# Patient Record
Sex: Male | Born: 1945 | Race: White | Hispanic: No | Marital: Married | Smoking: Never smoker
Health system: Southern US, Community
[De-identification: ages and names within clinical notes are randomized; demographics above are authoritative.]

## PROBLEM LIST (undated history)

## (undated) DIAGNOSIS — M1990 Unspecified osteoarthritis, unspecified site: Secondary | ICD-10-CM

## (undated) DIAGNOSIS — I10 Essential (primary) hypertension: Secondary | ICD-10-CM

## (undated) DIAGNOSIS — C801 Malignant (primary) neoplasm, unspecified: Secondary | ICD-10-CM

## (undated) DIAGNOSIS — Z9289 Personal history of other medical treatment: Secondary | ICD-10-CM

## (undated) DIAGNOSIS — R7303 Prediabetes: Secondary | ICD-10-CM

## (undated) DIAGNOSIS — K760 Fatty (change of) liver, not elsewhere classified: Secondary | ICD-10-CM

## (undated) DIAGNOSIS — Z87442 Personal history of urinary calculi: Secondary | ICD-10-CM

## (undated) DIAGNOSIS — E119 Type 2 diabetes mellitus without complications: Secondary | ICD-10-CM

## (undated) DIAGNOSIS — G4730 Sleep apnea, unspecified: Secondary | ICD-10-CM

## (undated) HISTORY — PX: CARPAL TUNNEL RELEASE: SHX101

## (undated) HISTORY — PX: LITHOTRIPSY: SUR834

## (undated) HISTORY — PX: JOINT REPLACEMENT: SHX530

## (undated) HISTORY — PX: COLONOSCOPY: SHX174

## (undated) HISTORY — PX: CYSTOSCOPY/RETROGRADE/URETEROSCOPY/STONE EXTRACTION WITH BASKET: SHX5317

---
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
## 1982-03-06 HISTORY — PX: KIDNEY STONE SURGERY: SHX686

## 2008-05-14 ENCOUNTER — Encounter: Admission: RE | Admit: 2008-05-14 | Discharge: 2008-05-14 | Payer: Self-pay | Admitting: Neurosurgery

## 2008-06-17 ENCOUNTER — Encounter: Admission: RE | Admit: 2008-06-17 | Discharge: 2008-06-17 | Payer: Self-pay | Admitting: Neurosurgery

## 2008-12-18 ENCOUNTER — Encounter: Admission: RE | Admit: 2008-12-18 | Discharge: 2008-12-18 | Payer: Self-pay | Admitting: Neurosurgery

## 2010-03-06 HISTORY — PX: TOTAL HIP ARTHROPLASTY: SHX124

## 2010-03-27 ENCOUNTER — Encounter: Payer: Self-pay | Admitting: Neurosurgery

## 2010-12-21 ENCOUNTER — Other Ambulatory Visit (HOSPITAL_COMMUNITY): Payer: Self-pay | Admitting: Orthopedic Surgery

## 2010-12-21 ENCOUNTER — Other Ambulatory Visit: Payer: Self-pay | Admitting: Orthopedic Surgery

## 2010-12-21 ENCOUNTER — Ambulatory Visit (HOSPITAL_COMMUNITY)
Admission: RE | Admit: 2010-12-21 | Discharge: 2010-12-21 | Disposition: A | Discharge status: Home / Self Care | Payer: Medicare Other | Source: Ambulatory Visit | Attending: Orthopedic Surgery | Admitting: Orthopedic Surgery

## 2010-12-21 ENCOUNTER — Encounter (HOSPITAL_COMMUNITY): Payer: Medicare Other

## 2010-12-21 DIAGNOSIS — Z01818 Encounter for other preprocedural examination: Secondary | ICD-10-CM | POA: Insufficient documentation

## 2010-12-21 DIAGNOSIS — M47814 Spondylosis without myelopathy or radiculopathy, thoracic region: Secondary | ICD-10-CM | POA: Insufficient documentation

## 2010-12-21 DIAGNOSIS — Z01811 Encounter for preprocedural respiratory examination: Secondary | ICD-10-CM

## 2010-12-21 DIAGNOSIS — M169 Osteoarthritis of hip, unspecified: Secondary | ICD-10-CM | POA: Insufficient documentation

## 2010-12-21 DIAGNOSIS — Z01810 Encounter for preprocedural cardiovascular examination: Secondary | ICD-10-CM | POA: Insufficient documentation

## 2010-12-21 DIAGNOSIS — M1610 Unilateral primary osteoarthritis, unspecified hip: Secondary | ICD-10-CM | POA: Insufficient documentation

## 2010-12-21 DIAGNOSIS — Z01812 Encounter for preprocedural laboratory examination: Secondary | ICD-10-CM | POA: Insufficient documentation

## 2010-12-21 LAB — PROTIME-INR
INR: 1.03 (ref 0.00–1.49)
Prothrombin Time: 13.7 seconds (ref 11.6–15.2)

## 2010-12-21 LAB — COMPREHENSIVE METABOLIC PANEL
Albumin: 4 g/dL (ref 3.5–5.2)
Alkaline Phosphatase: 76 U/L (ref 39–117)
CO2: 27 mEq/L (ref 19–32)
Calcium: 10.2 mg/dL (ref 8.4–10.5)
Chloride: 104 mEq/L (ref 96–112)
GFR calc Af Amer: >90 mL/min (ref >90)
GFR calc non Af Amer: >90 mL/min (ref >90)
Glucose, Bld: 105 mg/dL — ABNORMAL HIGH (ref 70–99)
Potassium: 4.4 mEq/L (ref 3.5–5.1)
Total Bilirubin: 0.4 mg/dL (ref 0.3–1.2)
Total Protein: 7.3 g/dL (ref 6.0–8.3)

## 2010-12-21 LAB — CBC
Hemoglobin: 14.8 g/dL (ref 13.0–17.0)
MCHC: 33 g/dL (ref 30.0–36.0)
MCV: 89.1 fL (ref 78.0–100.0)
RBC: 5.04 MIL/uL (ref 4.22–5.81)
RDW: 13.7 % (ref 11.5–15.5)

## 2010-12-21 LAB — URINALYSIS, ROUTINE W REFLEX MICROSCOPIC: Glucose, UA: NEGATIVE mg/dL

## 2010-12-21 LAB — SURGICAL PCR SCREEN: Staphylococcus aureus: NEGATIVE

## 2010-12-26 ENCOUNTER — Inpatient Hospital Stay (HOSPITAL_COMMUNITY): Payer: Medicare Other

## 2010-12-26 ENCOUNTER — Inpatient Hospital Stay (HOSPITAL_COMMUNITY)
Admission: RE | Admit: 2010-12-26 | Discharge: 2010-12-29 | DRG: 470 | Disposition: A | Discharge status: Home Health | Payer: Medicare Other | Source: Ambulatory Visit | Attending: Orthopedic Surgery | Admitting: Orthopedic Surgery

## 2010-12-26 DIAGNOSIS — Z79899 Other long term (current) drug therapy: Secondary | ICD-10-CM

## 2010-12-26 DIAGNOSIS — E669 Obesity, unspecified: Secondary | ICD-10-CM | POA: Diagnosis present

## 2010-12-26 DIAGNOSIS — M545 Low back pain, unspecified: Secondary | ICD-10-CM | POA: Diagnosis present

## 2010-12-26 DIAGNOSIS — Z87442 Personal history of urinary calculi: Secondary | ICD-10-CM

## 2010-12-26 DIAGNOSIS — M1610 Unilateral primary osteoarthritis, unspecified hip: Principal | ICD-10-CM | POA: Diagnosis present

## 2010-12-26 DIAGNOSIS — G8929 Other chronic pain: Secondary | ICD-10-CM | POA: Diagnosis present

## 2010-12-26 DIAGNOSIS — Z01810 Encounter for preprocedural cardiovascular examination: Secondary | ICD-10-CM

## 2010-12-26 DIAGNOSIS — I10 Essential (primary) hypertension: Secondary | ICD-10-CM | POA: Diagnosis present

## 2010-12-26 DIAGNOSIS — H9190 Unspecified hearing loss, unspecified ear: Secondary | ICD-10-CM | POA: Diagnosis present

## 2010-12-26 DIAGNOSIS — Z7982 Long term (current) use of aspirin: Secondary | ICD-10-CM

## 2010-12-26 DIAGNOSIS — M169 Osteoarthritis of hip, unspecified: Principal | ICD-10-CM | POA: Diagnosis present

## 2010-12-26 DIAGNOSIS — Z01812 Encounter for preprocedural laboratory examination: Secondary | ICD-10-CM

## 2010-12-26 LAB — TYPE AND SCREEN: Antibody Screen: NEGATIVE

## 2010-12-27 LAB — BASIC METABOLIC PANEL
CO2: 28 mEq/L (ref 19–32)
Calcium: 8.9 mg/dL (ref 8.4–10.5)
Creatinine, Ser: 0.53 mg/dL (ref 0.50–1.35)
GFR calc Af Amer: >90 mL/min (ref >90)
GFR calc non Af Amer: >90 mL/min (ref >90)
Potassium: 3.9 mEq/L (ref 3.5–5.1)

## 2010-12-27 LAB — CBC
Hemoglobin: 12.2 g/dL — ABNORMAL LOW (ref 13.0–17.0)
MCH: 29.3 pg (ref 26.0–34.0)
RBC: 4.17 MIL/uL — ABNORMAL LOW (ref 4.22–5.81)
RDW: 13.7 % (ref 11.5–15.5)
WBC: 7.8 10*3/uL (ref 4.0–10.5)

## 2010-12-28 LAB — BASIC METABOLIC PANEL
CO2: 28 mEq/L (ref 19–32)
Calcium: 9 mg/dL (ref 8.4–10.5)
GFR calc non Af Amer: >90 mL/min (ref >90)
Potassium: 3.8 mEq/L (ref 3.5–5.1)
Sodium: 138 mEq/L (ref 135–145)

## 2010-12-28 LAB — CBC
HCT: 36.6 % — ABNORMAL LOW (ref 39.0–52.0)
Hemoglobin: 11.9 g/dL — ABNORMAL LOW (ref 13.0–17.0)
MCHC: 32.5 g/dL (ref 30.0–36.0)

## 2010-12-28 NOTE — Op Note (Signed)
NAMEMarland Kitchen  SONG, Douglas NO.:  [id number]  MEDICAL RECORD NO.:  [id number]  LOCATION:  1618                         FACILITY:  [facility]  PHYSICIAN:  Ollen Gross, M.D.    DATE OF BIRTH:  Jul 28, 1945  DATE OF PROCEDURE:  12/26/2010 DATE OF DISCHARGE:                              OPERATIVE REPORT   PREOPERATIVE DIAGNOSIS:  Osteoarthritis, left hip.  POSTOPERATIVE DIAGNOSIS:  Osteoarthritis, left hip.  PROCEDURE:  Left total hip arthroplasty.  SURGEON:  Ollen Gross, M.D.  ASSISTANT:  Alexzandrew L. Perkins, P.A.C.  ANESTHESIA:  General.  ESTIMATED BLOOD LOSS:  200.  DRAINS:  Hemovac x1.  COMPLICATIONS:  None.  CONDITION:  Stable to recovery.  BRIEF CLINICAL NOTE:  Douglas Rasmussen is a 65 year old male, who has advanced bone-on-bone arthritis of the left hip with progressively worsening pain and dysfunction.  He has had pain for over a year getting progressively worse to the point where he was having a hard time ambulating and hard time sleeping.  He has pain at rest and with activity.  He initially had MRI with edema in his acetabulum femoral head with normal x-rays with over a 6 month time, span has progressed the bone-on-bone arthritis. Subchondral cyst formation also.  He is at a stage now where nonoperative management and has not produced any relief.  He has been taking oxycodone without benefit.  He has had intraarticular injection, presents now for left total hip arthroplasty.  PROCEDURE IN DETAIL:  After successful administration of general anesthetic, the patient was placed in the right lateral decubitus position with the left side up and held with the hip positioner.  Left lower extremity was isolated from his perineum with plastic drapes and prepped and draped in the usual sterile fashion.  Short posterolateral incision was made with a 10 blade through subcutaneous tissue to the fascia lata, which was incised in line with the skin incision.   Sciatic nerve was palpated and protected and a short rotators and capsule were isolated off the femur.  Hip was dislocated and the center of femoral head was marked.  Trial prosthesis placed such at the center of the trial head corresponds to the center of his native femoral head. Osteotomy line was marked on the femoral neck and osteotomy made with an oscillating saw.  The femoral head was then removed.  Retractors were then placed around the proximal femur to gain access to the canal.  Starter reamers were passed through the femoral canal and the canal was thoroughly irrigated with saline to remove the fatty contents.  Axial reaming was performed to 15.5 mm, proximal reaming to 20 F and a 17 to a large.  Twenty F large trial sleeve was then placed.  Femur was then retracted anteriorly to gain acetabular exposure.  Acetabular retractors were placed and labrum and osteophytes were removed.  Acetabular reaming was performed up to 53 mm.  A 54 mm pinnacle acetabular shell was placed in anatomic position.  Had excellent purchase and no dome screws are necessary.  Apex hole eliminator was placed in a 36 mm neutral plus 4 marathon liner placed. The trial stem was placed, which is 20  x 15 with a 36 plus 12 head and a 36 plus 12 neck, and a 36 plus 0 femoral head.  The neck matches native anteversion.  The hips reduced with excellent stability.  There is full extension, full external rotation 70 degrees, flexion 40 degrees of adduction, 90 degrees of internal rotation, 90 degrees of flexion, and 70 degrees of internal rotation.  By placing the left leg on top of the right, I felt that though the leg lengths were equal.  Hips then dislocated and trials removed.  The permanent 20 F large sleeve was placed in a 20 x 15 stem with a 36 plus 12 neck matching native anteversion, 36 plus 0 ceramic head is placed.  The hips were reduced with the same stability parameters.  Wounds were copiously  irrigated with saline solution and then the short rotators and capsule reattached to the femur through drill holes with Ethibond suture.  Fascia lata was closed over Hemovac drain with interrupted #1 Vicryl.  A combination of 20 mL with Exparel with 50 mL of saline.  I then injected into the fascia lata, subcu tissues and the gluteal muscles.  The subcu was then closed with interrupted #1 and interrupted 2-0 Vicryl and subcuticular running 4-0 Monocryl.  The drains had hooked to suction.  Incision cleaned and dried and Steri-Strips and a bulky sterile dressing applied. He was then placed into a knee immobilizer, awakened, and transported to recovery in stable condition.     Ollen Gross, M.D.     FA/MEDQ  D:  12/26/2010  T:  12/27/2010  Job:  [id number]  Electronically Signed by Ollen Gross M.D. on 12/28/2010 11:33:34 AM

## 2010-12-28 NOTE — H&P (Addendum)
NAME:  Douglas Rasmussen, HYSON NO.:  [id number]  MEDICAL RECORD NO.:  [id number]  LOCATION:                               FACILITY:  [facility]  PHYSICIAN:  Ollen Gross, M.D.    DATE OF BIRTH:  [birth year]  DATE OF ADMISSION:  12/26/2010 DATE OF DISCHARGE:                             HISTORY & PHYSICAL   COMPLAINT:  Left hip pain.  HISTORY OF PRESENT ILLNESS:  The patient is a 65 year old male who has been seen by Dr. Lequita Halt for ongoing left hip pain.  He has been seen in second opinion for the ongoing left knee problems.  He has been seen by Dr. Channing Mutters.  Initially, he was thinking it was coming from his back.  Pain started into the groin and seen Dr. Cleophas Dunker, and thought it might be coming from his hip.  MRI showed edema in the acetabulum at the femoral head.  He had a intraarticular hip injection and felt worse.  He had increasing pain which is rapidly getting worse.  He was seen in consultation by Dr. Lequita Halt and found to have already developed bone-on- bone arthritis, rapidly gotten worse.  It was felt he would benefit from undergoing surgical intervention.  Risks and benefits were discussed. He elected to proceed with surgery.  He has been seen by Dr. Quintin Alto over in [facility] in [place], felt to be stable for upcoming procedure.  ALLERGIES:  No known drug allergies.  CURRENT MEDICATIONS: 1. Lisinopril 20 mg. 2. Generic Ambien. 3. BC powders. 4. Hydrocodone. 5. Centrum Silver. 6. Fish oil.  PAST MEDICAL HISTORY: 1. Hearing impairment, bilaterally. 2. Hypertension. 3. Renal calculi.  PAST SURGICAL HISTORY:  Tonsillectomy in 1958; kidney stone surgery in 1984; bilateral carpal tunnel surgery, left hand in 2003 and right hand in 2004, both by Dr. Amanda Pea; and colonoscopy twice in 2003 with polyp removal and in 2005 was clean.  FAMILY HISTORY:  Father deceased at age 70 with arthritis and alcohol. Mother   deceased at age 24 with diabetes, arthritis, and stroke.  SOCIAL HISTORY:  Married.  Retired.  Past smoker. 3-4 4 beers a week. Wife will be assisting with care after surgery.  REVIEW OF SYSTEMS:  GENERAL:  No fevers, chills, or night sweats. NEUROLOGIC:  No seizures, syncope, or paralysis.  RESPIRATORY:  No shortness breath, productive cough, or hemoptysis.  CARDIOVASCULAR:  No chest pain or orthopnea.  GASTROINTESTINAL:  No nausea, vomiting, diarrhea, or constipation.  GENITOURINARY:  No dysuria, hematuria, or discharge.  Musculoskeletal:  Hip pain.  PHYSICAL EXAMINATION:  VITAL SIGNS:  Pulse 68, respirations 12, and blood pressure 112/68. GENERAL:  A 65 year old white male, well nourished, well developed, in no acute distress, slightly overweight.  Alert, oriented, and cooperative. HEENT:  Normocephalic, atraumatic.  Pupils are round and reactive.  EOMs intact.  Not wear glasses. NECK:  Supple.  No carotid bruits. CHEST:  Clear. HEART:  Regular rate and rhythm without murmur.  S1 and S2. ABDOMEN:  Soft, nontender.  Bowel sounds present. RECTAL/BREASTS/GENITALIA:  Not done as not part of present illness. EXTREMITIES:  Left hip flexion 90, 0 internal rotation, 10 degrees  external rotation, 10 to 20 degrees abduction.  Does ambulate with an antalgic gait.  IMPRESSION:  Osteoarthritis, left hip.  PLAN:  The patient was admitted to [facility] to undergo a left total hip replacement arthroplasty.  Surgery will be performed Dr. Ollen Gross.     Douglas Rasmussen, P.A.C.   ______________________________ Ollen Gross, M.D.    ALP/MEDQ  D:  12/25/2010  T:  12/26/2010  Job:  [id number]  cc:   Quintin Alto, MD Fax: [phone]  Payton Doughty, M.D. Fax: [phone]  Electronically Signed by Patrica Duel P.A.C. on 12/26/2010 04:25:16 PM Electronically Signed by Ollen Gross M.D. on 12/28/2010 11:33:18 AM Electronically Signed by Ollen Gross M.D. on  12/28/2010 11:34:27 AM

## 2010-12-29 LAB — CBC
MCH: 29.3 pg (ref 26.0–34.0)
MCHC: 31.8 g/dL (ref 30.0–36.0)
MCV: 92.1 fL (ref 78.0–100.0)
Platelets: 186 10*3/uL (ref 150–400)
WBC: 8.2 10*3/uL (ref 4.0–10.5)

## 2011-01-17 NOTE — Discharge Summary (Signed)
NAMEMarland Kitchen  Douglas Rasmussen, Douglas Rasmussen NO.:  [id number]  MEDICAL RECORD NO.:  [id number]  LOCATION:  1618                         FACILITY:  [facility]  PHYSICIAN:  Alexzandrew L. Perkins, P.A.C.DATE OF BIRTH:  1945/11/26  DATE OF ADMISSION:  12/26/2010 DATE OF DISCHARGE:  12/29/2010                              DISCHARGE SUMMARY   ADMITTING DIAGNOSES: 1. Osteoarthritis, left hip. 2. Impaired hearing. 3. Hypertension. 4. Renal calculi.  DISCHARGE DIAGNOSES: 1. Osteoarthritis, left hip status post left total hip replacement     arthroplasty. 2. Impaired hearing. 3. Hypertension. 4. Renal calculi.  PROCEDURE:  December 26, 2010, left total hip.  SURGEON:  Ollen Gross, M.D.  ASSISTANT:  Alexzandrew L. Perkins, P.A.C.  ANESTHESIA:  General.  CONSULTS:  None.  BRIEF HISTORY:  The patient is a 65 year old male with advanced arthritis of bone-on-bone in the left hip, progressive worsening pain and dysfunction.  It has gotten worse over the past year.  X-rays now show bone-on-bone with subchondral cyst formation.  He has had intra- articular injection, now presents for total hip arthroplasty.  LABORATORY DATA:  The admission CBC not scanned in the chart, but the postoperative hemoglobin 12.2, drifted down to 11.9.  Last night hemoglobin and  hematocrit 11.5 and 36.2.  Chemistry panel on admission not scanned in the chart.  Serial BMETs were followed for 48 hours. Electrolytes remained within normal limits.  A blood group type A positive.  X-RAYS:  Hip and pelvis film shows satisfactory left total hip replacement.  HOSPITAL COURSE:  The patient admitted to [facility], taken to OR, underwent above-stated procedure without complication.  The patient tolerated procedure well, later transferred to recovery room from orthopedic floor.  Doing pretty well on the morning of day #1, started getting up out of bed.  Hemovac drain was pulled.  Started back on all of his  home meds.  Hemoglobin looked good.  Had excellent urinary output.  By day #2, he started getting up with therapy, walking well. Dressing changed.  Incision looked good.  By day #3, he was meeting his goals, tolerating his meds and discharged home.  DISCHARGE PLAN: 1. The patient discharged home on December 29, 2010. 2. Discharge diagnoses:  Please see above. 3. Discharge meds:  OxyIR, Robaxin, Xarelto.  Continue home meds of     Ambien, lisinopril. 4. Diet:  Heart healthy diet. 5. Activity:  Partial weightbearing 25-50%.  Hip precautions.  Total     hip protocol.  Follow up 2 weeks.  DISPOSITION AND CONDITION ON DISCHARGE:  Improved.     Alexzandrew L. Perkins, P.A.C.     ALP/MEDQ  D:  01/17/2011  T:  01/17/2011  Job:  [id number]  cc:   Quintin Alto, MD Fax: [phone]  Payton Doughty, M.D. Fax: [phone]

## 2011-03-09 DIAGNOSIS — R7309 Other abnormal glucose: Secondary | ICD-10-CM | POA: Diagnosis not present

## 2011-03-09 DIAGNOSIS — E7800 Pure hypercholesterolemia, unspecified: Secondary | ICD-10-CM | POA: Diagnosis not present

## 2011-03-14 DIAGNOSIS — I10 Essential (primary) hypertension: Secondary | ICD-10-CM | POA: Diagnosis not present

## 2011-03-14 DIAGNOSIS — G4700 Insomnia, unspecified: Secondary | ICD-10-CM | POA: Diagnosis not present

## 2011-03-14 DIAGNOSIS — E782 Mixed hyperlipidemia: Secondary | ICD-10-CM | POA: Diagnosis not present

## 2011-03-14 DIAGNOSIS — R7301 Impaired fasting glucose: Secondary | ICD-10-CM | POA: Diagnosis not present

## 2011-03-14 DIAGNOSIS — M1990 Unspecified osteoarthritis, unspecified site: Secondary | ICD-10-CM | POA: Diagnosis not present

## 2011-03-14 DIAGNOSIS — F17200 Nicotine dependence, unspecified, uncomplicated: Secondary | ICD-10-CM | POA: Diagnosis not present

## 2011-04-25 DIAGNOSIS — M169 Osteoarthritis of hip, unspecified: Secondary | ICD-10-CM | POA: Diagnosis not present

## 2011-04-25 DIAGNOSIS — M47817 Spondylosis without myelopathy or radiculopathy, lumbosacral region: Secondary | ICD-10-CM | POA: Diagnosis not present

## 2011-09-13 DIAGNOSIS — E782 Mixed hyperlipidemia: Secondary | ICD-10-CM | POA: Diagnosis not present

## 2011-09-13 DIAGNOSIS — R7301 Impaired fasting glucose: Secondary | ICD-10-CM | POA: Diagnosis not present

## 2011-09-13 DIAGNOSIS — N400 Enlarged prostate without lower urinary tract symptoms: Secondary | ICD-10-CM | POA: Diagnosis not present

## 2011-09-13 DIAGNOSIS — M1990 Unspecified osteoarthritis, unspecified site: Secondary | ICD-10-CM | POA: Diagnosis not present

## 2011-09-13 DIAGNOSIS — F17200 Nicotine dependence, unspecified, uncomplicated: Secondary | ICD-10-CM | POA: Diagnosis not present

## 2011-09-13 DIAGNOSIS — I10 Essential (primary) hypertension: Secondary | ICD-10-CM | POA: Diagnosis not present

## 2011-09-20 DIAGNOSIS — I10 Essential (primary) hypertension: Secondary | ICD-10-CM | POA: Diagnosis not present

## 2011-09-20 DIAGNOSIS — G4700 Insomnia, unspecified: Secondary | ICD-10-CM | POA: Diagnosis not present

## 2011-09-20 DIAGNOSIS — Z0000 Encounter for general adult medical examination without abnormal findings: Secondary | ICD-10-CM | POA: Diagnosis not present

## 2011-09-20 DIAGNOSIS — E782 Mixed hyperlipidemia: Secondary | ICD-10-CM | POA: Diagnosis not present

## 2011-09-20 DIAGNOSIS — M1990 Unspecified osteoarthritis, unspecified site: Secondary | ICD-10-CM | POA: Diagnosis not present

## 2011-09-20 DIAGNOSIS — R7301 Impaired fasting glucose: Secondary | ICD-10-CM | POA: Diagnosis not present

## 2011-09-20 DIAGNOSIS — Z23 Encounter for immunization: Secondary | ICD-10-CM | POA: Diagnosis not present

## 2011-10-23 DIAGNOSIS — M47817 Spondylosis without myelopathy or radiculopathy, lumbosacral region: Secondary | ICD-10-CM | POA: Diagnosis not present

## 2012-01-23 DIAGNOSIS — Z96649 Presence of unspecified artificial hip joint: Secondary | ICD-10-CM | POA: Diagnosis not present

## 2012-03-13 DIAGNOSIS — I10 Essential (primary) hypertension: Secondary | ICD-10-CM | POA: Diagnosis not present

## 2012-03-13 DIAGNOSIS — R7309 Other abnormal glucose: Secondary | ICD-10-CM | POA: Diagnosis not present

## 2012-03-13 DIAGNOSIS — E782 Mixed hyperlipidemia: Secondary | ICD-10-CM | POA: Diagnosis not present

## 2012-04-04 DIAGNOSIS — H5200 Hypermetropia, unspecified eye: Secondary | ICD-10-CM | POA: Diagnosis not present

## 2012-04-04 DIAGNOSIS — H52229 Regular astigmatism, unspecified eye: Secondary | ICD-10-CM | POA: Diagnosis not present

## 2012-04-04 DIAGNOSIS — D3130 Benign neoplasm of unspecified choroid: Secondary | ICD-10-CM | POA: Diagnosis not present

## 2012-04-04 DIAGNOSIS — H2510 Age-related nuclear cataract, unspecified eye: Secondary | ICD-10-CM | POA: Diagnosis not present

## 2012-04-22 DIAGNOSIS — H35379 Puckering of macula, unspecified eye: Secondary | ICD-10-CM | POA: Diagnosis not present

## 2012-04-22 DIAGNOSIS — H2510 Age-related nuclear cataract, unspecified eye: Secondary | ICD-10-CM | POA: Diagnosis not present

## 2012-09-24 DIAGNOSIS — R7301 Impaired fasting glucose: Secondary | ICD-10-CM | POA: Diagnosis not present

## 2012-09-24 DIAGNOSIS — I10 Essential (primary) hypertension: Secondary | ICD-10-CM | POA: Diagnosis not present

## 2012-09-24 DIAGNOSIS — E782 Mixed hyperlipidemia: Secondary | ICD-10-CM | POA: Diagnosis not present

## 2012-09-25 DIAGNOSIS — E782 Mixed hyperlipidemia: Secondary | ICD-10-CM | POA: Diagnosis not present

## 2012-10-02 DIAGNOSIS — I10 Essential (primary) hypertension: Secondary | ICD-10-CM | POA: Diagnosis not present

## 2012-10-02 DIAGNOSIS — F17200 Nicotine dependence, unspecified, uncomplicated: Secondary | ICD-10-CM | POA: Diagnosis not present

## 2012-10-02 DIAGNOSIS — M1990 Unspecified osteoarthritis, unspecified site: Secondary | ICD-10-CM | POA: Diagnosis not present

## 2012-10-02 DIAGNOSIS — E782 Mixed hyperlipidemia: Secondary | ICD-10-CM | POA: Diagnosis not present

## 2012-10-02 DIAGNOSIS — G4700 Insomnia, unspecified: Secondary | ICD-10-CM | POA: Diagnosis not present

## 2012-10-02 DIAGNOSIS — R7301 Impaired fasting glucose: Secondary | ICD-10-CM | POA: Diagnosis not present

## 2012-10-02 DIAGNOSIS — Z0000 Encounter for general adult medical examination without abnormal findings: Secondary | ICD-10-CM | POA: Diagnosis not present

## 2012-12-12 DIAGNOSIS — N201 Calculus of ureter: Secondary | ICD-10-CM | POA: Diagnosis not present

## 2012-12-12 DIAGNOSIS — R918 Other nonspecific abnormal finding of lung field: Secondary | ICD-10-CM | POA: Diagnosis not present

## 2012-12-12 DIAGNOSIS — Z79899 Other long term (current) drug therapy: Secondary | ICD-10-CM | POA: Diagnosis not present

## 2012-12-12 DIAGNOSIS — F17200 Nicotine dependence, unspecified, uncomplicated: Secondary | ICD-10-CM | POA: Diagnosis not present

## 2012-12-12 DIAGNOSIS — N23 Unspecified renal colic: Secondary | ICD-10-CM | POA: Diagnosis not present

## 2012-12-12 DIAGNOSIS — I10 Essential (primary) hypertension: Secondary | ICD-10-CM | POA: Diagnosis not present

## 2012-12-12 DIAGNOSIS — N1330 Unspecified hydronephrosis: Secondary | ICD-10-CM | POA: Diagnosis not present

## 2012-12-13 DIAGNOSIS — N200 Calculus of kidney: Secondary | ICD-10-CM | POA: Diagnosis not present

## 2012-12-13 DIAGNOSIS — R351 Nocturia: Secondary | ICD-10-CM | POA: Diagnosis not present

## 2012-12-13 DIAGNOSIS — R39198 Other difficulties with micturition: Secondary | ICD-10-CM | POA: Diagnosis not present

## 2012-12-13 DIAGNOSIS — R3915 Urgency of urination: Secondary | ICD-10-CM | POA: Diagnosis not present

## 2012-12-25 DIAGNOSIS — E782 Mixed hyperlipidemia: Secondary | ICD-10-CM | POA: Diagnosis not present

## 2012-12-25 DIAGNOSIS — I10 Essential (primary) hypertension: Secondary | ICD-10-CM | POA: Diagnosis not present

## 2012-12-25 DIAGNOSIS — R7301 Impaired fasting glucose: Secondary | ICD-10-CM | POA: Diagnosis not present

## 2012-12-30 DIAGNOSIS — E782 Mixed hyperlipidemia: Secondary | ICD-10-CM | POA: Diagnosis not present

## 2012-12-30 DIAGNOSIS — M1990 Unspecified osteoarthritis, unspecified site: Secondary | ICD-10-CM | POA: Diagnosis not present

## 2012-12-30 DIAGNOSIS — Z23 Encounter for immunization: Secondary | ICD-10-CM | POA: Diagnosis not present

## 2012-12-30 DIAGNOSIS — N200 Calculus of kidney: Secondary | ICD-10-CM | POA: Diagnosis not present

## 2012-12-30 DIAGNOSIS — G4700 Insomnia, unspecified: Secondary | ICD-10-CM | POA: Diagnosis not present

## 2012-12-30 DIAGNOSIS — I10 Essential (primary) hypertension: Secondary | ICD-10-CM | POA: Diagnosis not present

## 2012-12-30 DIAGNOSIS — R7301 Impaired fasting glucose: Secondary | ICD-10-CM | POA: Diagnosis not present

## 2012-12-30 DIAGNOSIS — F17200 Nicotine dependence, unspecified, uncomplicated: Secondary | ICD-10-CM | POA: Diagnosis not present

## 2013-02-03 DIAGNOSIS — D126 Benign neoplasm of colon, unspecified: Secondary | ICD-10-CM | POA: Diagnosis not present

## 2013-03-13 DIAGNOSIS — Z86010 Personal history of colonic polyps: Secondary | ICD-10-CM | POA: Diagnosis not present

## 2013-03-13 DIAGNOSIS — Z87442 Personal history of urinary calculi: Secondary | ICD-10-CM | POA: Diagnosis not present

## 2013-03-13 DIAGNOSIS — F17200 Nicotine dependence, unspecified, uncomplicated: Secondary | ICD-10-CM | POA: Diagnosis not present

## 2013-03-13 DIAGNOSIS — Z833 Family history of diabetes mellitus: Secondary | ICD-10-CM | POA: Diagnosis not present

## 2013-03-13 DIAGNOSIS — Z79899 Other long term (current) drug therapy: Secondary | ICD-10-CM | POA: Diagnosis not present

## 2013-03-13 DIAGNOSIS — D126 Benign neoplasm of colon, unspecified: Secondary | ICD-10-CM | POA: Diagnosis not present

## 2013-03-13 DIAGNOSIS — Z96649 Presence of unspecified artificial hip joint: Secondary | ICD-10-CM | POA: Diagnosis not present

## 2013-03-13 DIAGNOSIS — I10 Essential (primary) hypertension: Secondary | ICD-10-CM | POA: Diagnosis not present

## 2013-03-13 DIAGNOSIS — Z1211 Encounter for screening for malignant neoplasm of colon: Secondary | ICD-10-CM | POA: Diagnosis not present

## 2013-03-24 DIAGNOSIS — E782 Mixed hyperlipidemia: Secondary | ICD-10-CM | POA: Diagnosis not present

## 2013-03-24 DIAGNOSIS — I10 Essential (primary) hypertension: Secondary | ICD-10-CM | POA: Diagnosis not present

## 2013-03-24 DIAGNOSIS — F17200 Nicotine dependence, unspecified, uncomplicated: Secondary | ICD-10-CM | POA: Diagnosis not present

## 2013-03-24 DIAGNOSIS — R7301 Impaired fasting glucose: Secondary | ICD-10-CM | POA: Diagnosis not present

## 2013-03-31 DIAGNOSIS — G4700 Insomnia, unspecified: Secondary | ICD-10-CM | POA: Diagnosis not present

## 2013-03-31 DIAGNOSIS — R7301 Impaired fasting glucose: Secondary | ICD-10-CM | POA: Diagnosis not present

## 2013-03-31 DIAGNOSIS — N200 Calculus of kidney: Secondary | ICD-10-CM | POA: Diagnosis not present

## 2013-03-31 DIAGNOSIS — I10 Essential (primary) hypertension: Secondary | ICD-10-CM | POA: Diagnosis not present

## 2013-03-31 DIAGNOSIS — E782 Mixed hyperlipidemia: Secondary | ICD-10-CM | POA: Diagnosis not present

## 2013-03-31 DIAGNOSIS — Q619 Cystic kidney disease, unspecified: Secondary | ICD-10-CM | POA: Diagnosis not present

## 2013-03-31 DIAGNOSIS — M1990 Unspecified osteoarthritis, unspecified site: Secondary | ICD-10-CM | POA: Diagnosis not present

## 2013-03-31 DIAGNOSIS — F17200 Nicotine dependence, unspecified, uncomplicated: Secondary | ICD-10-CM | POA: Diagnosis not present

## 2013-06-23 DIAGNOSIS — E782 Mixed hyperlipidemia: Secondary | ICD-10-CM | POA: Diagnosis not present

## 2013-06-23 DIAGNOSIS — I10 Essential (primary) hypertension: Secondary | ICD-10-CM | POA: Diagnosis not present

## 2013-06-23 DIAGNOSIS — R7301 Impaired fasting glucose: Secondary | ICD-10-CM | POA: Diagnosis not present

## 2013-06-30 DIAGNOSIS — R7301 Impaired fasting glucose: Secondary | ICD-10-CM | POA: Diagnosis not present

## 2013-06-30 DIAGNOSIS — M1990 Unspecified osteoarthritis, unspecified site: Secondary | ICD-10-CM | POA: Diagnosis not present

## 2013-06-30 DIAGNOSIS — G4700 Insomnia, unspecified: Secondary | ICD-10-CM | POA: Diagnosis not present

## 2013-06-30 DIAGNOSIS — N200 Calculus of kidney: Secondary | ICD-10-CM | POA: Diagnosis not present

## 2013-06-30 DIAGNOSIS — I10 Essential (primary) hypertension: Secondary | ICD-10-CM | POA: Diagnosis not present

## 2013-06-30 DIAGNOSIS — F17200 Nicotine dependence, unspecified, uncomplicated: Secondary | ICD-10-CM | POA: Diagnosis not present

## 2013-06-30 DIAGNOSIS — E782 Mixed hyperlipidemia: Secondary | ICD-10-CM | POA: Diagnosis not present

## 2013-07-02 DIAGNOSIS — R109 Unspecified abdominal pain: Secondary | ICD-10-CM | POA: Diagnosis not present

## 2013-07-02 DIAGNOSIS — D3500 Benign neoplasm of unspecified adrenal gland: Secondary | ICD-10-CM | POA: Diagnosis not present

## 2013-08-25 DIAGNOSIS — H60509 Unspecified acute noninfective otitis externa, unspecified ear: Secondary | ICD-10-CM | POA: Diagnosis not present

## 2013-08-25 DIAGNOSIS — H6690 Otitis media, unspecified, unspecified ear: Secondary | ICD-10-CM | POA: Diagnosis not present

## 2014-01-07 DIAGNOSIS — I10 Essential (primary) hypertension: Secondary | ICD-10-CM | POA: Diagnosis not present

## 2014-01-07 DIAGNOSIS — R7301 Impaired fasting glucose: Secondary | ICD-10-CM | POA: Diagnosis not present

## 2014-01-07 DIAGNOSIS — E782 Mixed hyperlipidemia: Secondary | ICD-10-CM | POA: Diagnosis not present

## 2014-01-14 DIAGNOSIS — Z23 Encounter for immunization: Secondary | ICD-10-CM | POA: Diagnosis not present

## 2014-01-14 DIAGNOSIS — F17220 Nicotine dependence, chewing tobacco, uncomplicated: Secondary | ICD-10-CM | POA: Diagnosis not present

## 2014-01-14 DIAGNOSIS — M1991 Primary osteoarthritis, unspecified site: Secondary | ICD-10-CM | POA: Diagnosis not present

## 2014-01-14 DIAGNOSIS — R7301 Impaired fasting glucose: Secondary | ICD-10-CM | POA: Diagnosis not present

## 2014-01-14 DIAGNOSIS — Z1389 Encounter for screening for other disorder: Secondary | ICD-10-CM | POA: Diagnosis not present

## 2014-01-14 DIAGNOSIS — Z0000 Encounter for general adult medical examination without abnormal findings: Secondary | ICD-10-CM | POA: Diagnosis not present

## 2014-01-14 DIAGNOSIS — I10 Essential (primary) hypertension: Secondary | ICD-10-CM | POA: Diagnosis not present

## 2014-01-14 DIAGNOSIS — E782 Mixed hyperlipidemia: Secondary | ICD-10-CM | POA: Diagnosis not present

## 2014-02-08 DIAGNOSIS — S299XXA Unspecified injury of thorax, initial encounter: Secondary | ICD-10-CM | POA: Diagnosis not present

## 2014-02-08 DIAGNOSIS — M19211 Secondary osteoarthritis, right shoulder: Secondary | ICD-10-CM | POA: Diagnosis not present

## 2014-02-08 DIAGNOSIS — M19031 Primary osteoarthritis, right wrist: Secondary | ICD-10-CM | POA: Diagnosis not present

## 2014-02-08 DIAGNOSIS — S43402A Unspecified sprain of left shoulder joint, initial encounter: Secondary | ICD-10-CM | POA: Diagnosis not present

## 2014-02-08 DIAGNOSIS — S298XXA Other specified injuries of thorax, initial encounter: Secondary | ICD-10-CM | POA: Diagnosis not present

## 2014-02-08 DIAGNOSIS — Z720 Tobacco use: Secondary | ICD-10-CM | POA: Diagnosis not present

## 2014-02-08 DIAGNOSIS — Y939 Activity, unspecified: Secondary | ICD-10-CM | POA: Diagnosis not present

## 2014-02-08 DIAGNOSIS — S62101A Fracture of unspecified carpal bone, right wrist, initial encounter for closed fracture: Secondary | ICD-10-CM | POA: Diagnosis not present

## 2014-02-08 DIAGNOSIS — Z79899 Other long term (current) drug therapy: Secondary | ICD-10-CM | POA: Diagnosis not present

## 2014-02-08 DIAGNOSIS — G3189 Other specified degenerative diseases of nervous system: Secondary | ICD-10-CM | POA: Diagnosis not present

## 2014-02-08 DIAGNOSIS — S43401A Unspecified sprain of right shoulder joint, initial encounter: Secondary | ICD-10-CM | POA: Diagnosis not present

## 2014-02-08 DIAGNOSIS — I10 Essential (primary) hypertension: Secondary | ICD-10-CM | POA: Diagnosis not present

## 2014-02-09 DIAGNOSIS — S63502A Unspecified sprain of left wrist, initial encounter: Secondary | ICD-10-CM | POA: Diagnosis not present

## 2014-03-02 DIAGNOSIS — S63502A Unspecified sprain of left wrist, initial encounter: Secondary | ICD-10-CM | POA: Diagnosis not present

## 2014-03-25 DIAGNOSIS — M25531 Pain in right wrist: Secondary | ICD-10-CM | POA: Diagnosis not present

## 2014-04-09 DIAGNOSIS — Z471 Aftercare following joint replacement surgery: Secondary | ICD-10-CM | POA: Diagnosis not present

## 2014-04-09 DIAGNOSIS — Z96642 Presence of left artificial hip joint: Secondary | ICD-10-CM | POA: Diagnosis not present

## 2014-04-29 DIAGNOSIS — M19011 Primary osteoarthritis, right shoulder: Secondary | ICD-10-CM | POA: Diagnosis not present

## 2014-06-29 DIAGNOSIS — S0501XA Injury of conjunctiva and corneal abrasion without foreign body, right eye, initial encounter: Secondary | ICD-10-CM | POA: Diagnosis not present

## 2014-07-02 DIAGNOSIS — S0501XD Injury of conjunctiva and corneal abrasion without foreign body, right eye, subsequent encounter: Secondary | ICD-10-CM | POA: Diagnosis not present

## 2014-07-13 DIAGNOSIS — I10 Essential (primary) hypertension: Secondary | ICD-10-CM | POA: Diagnosis not present

## 2014-07-13 DIAGNOSIS — E782 Mixed hyperlipidemia: Secondary | ICD-10-CM | POA: Diagnosis not present

## 2014-07-13 DIAGNOSIS — R7301 Impaired fasting glucose: Secondary | ICD-10-CM | POA: Diagnosis not present

## 2014-07-20 DIAGNOSIS — E782 Mixed hyperlipidemia: Secondary | ICD-10-CM | POA: Diagnosis not present

## 2014-07-20 DIAGNOSIS — M1991 Primary osteoarthritis, unspecified site: Secondary | ICD-10-CM | POA: Diagnosis not present

## 2014-07-20 DIAGNOSIS — I10 Essential (primary) hypertension: Secondary | ICD-10-CM | POA: Diagnosis not present

## 2014-07-20 DIAGNOSIS — F17220 Nicotine dependence, chewing tobacco, uncomplicated: Secondary | ICD-10-CM | POA: Diagnosis not present

## 2014-07-20 DIAGNOSIS — R7301 Impaired fasting glucose: Secondary | ICD-10-CM | POA: Diagnosis not present

## 2014-08-10 DIAGNOSIS — H35373 Puckering of macula, bilateral: Secondary | ICD-10-CM | POA: Diagnosis not present

## 2014-08-10 DIAGNOSIS — H2513 Age-related nuclear cataract, bilateral: Secondary | ICD-10-CM | POA: Diagnosis not present

## 2014-08-10 DIAGNOSIS — H5203 Hypermetropia, bilateral: Secondary | ICD-10-CM | POA: Diagnosis not present

## 2014-08-10 DIAGNOSIS — H1045 Other chronic allergic conjunctivitis: Secondary | ICD-10-CM | POA: Diagnosis not present

## 2014-10-19 DIAGNOSIS — H524 Presbyopia: Secondary | ICD-10-CM | POA: Diagnosis not present

## 2014-10-19 DIAGNOSIS — H5203 Hypermetropia, bilateral: Secondary | ICD-10-CM | POA: Diagnosis not present

## 2014-10-19 DIAGNOSIS — H2513 Age-related nuclear cataract, bilateral: Secondary | ICD-10-CM | POA: Diagnosis not present

## 2014-10-19 DIAGNOSIS — H35373 Puckering of macula, bilateral: Secondary | ICD-10-CM | POA: Diagnosis not present

## 2014-10-22 DIAGNOSIS — H2511 Age-related nuclear cataract, right eye: Secondary | ICD-10-CM | POA: Diagnosis not present

## 2014-10-23 NOTE — Patient Instructions (Addendum)
Douglas Rasmussen  10/23/2014     @PREFPERIOPPHARMACY @   Your procedure is scheduled on 10/29/2014  Report to [facility] at 6:40 A.M.  Call this number if you have problems the morning of surgery:  [phone]   Remember:  Do not eat food or drink liquids after midnight.  Take these medicines the morning of surgery with A SIP OF WATER Flonase, Lisinopril   Do not wear jewelry, make-up or nail polish.  Do not wear lotions, powders, or perfumes.  You may wear deodorant.  Do not shave 48 hours prior to surgery.  Men may shave face and neck.  Do not bring valuables to the hospital.  [facility] is not responsible for any belongings or valuables.  Contacts, dentures or bridgework may not be worn into surgery.  Leave your suitcase in the car.  After surgery it may be brought to your room.  For patients admitted to the hospital, discharge time will be determined by your treatment team.  Patients discharged the day of surgery will not be allowed to drive home.    Please read over the following fact sheets that you were given. Anesthesia Post-op Instructions     PATIENT INSTRUCTIONS POST-ANESTHESIA  IMMEDIATELY FOLLOWING SURGERY:  Do not drive or operate machinery for the first twenty four hours after surgery.  Do not make any important decisions for twenty four hours after surgery or while taking narcotic pain medications or sedatives.  If you develop intractable nausea and vomiting or a severe headache please notify your doctor immediately.  FOLLOW-UP:  Please make an appointment with your surgeon as instructed. You do not need to follow up with anesthesia unless specifically instructed to do so.  WOUND CARE INSTRUCTIONS (if applicable):  Keep a dry clean dressing on the anesthesia/puncture wound site if there is drainage.  Once the wound has quit draining you may leave it open to air.  Generally you should leave the bandage intact for twenty four hours unless there is drainage.  If  the epidural site drains for more than 36-48 hours please call the anesthesia department.  QUESTIONS?:  Please feel free to call your physician or the hospital operator if you have any questions, and they will be happy to assist you.      Cataract Surgery  A cataract is a clouding of the lens of the eye. When a lens becomes cloudy, vision is reduced based on the degree and nature of the clouding. Surgery may be needed to improve vision. Surgery removes the cloudy lens and usually replaces it with a substitute lens (intraocular lens, IOL). LET YOUR EYE DOCTOR KNOW ABOUT:  Allergies to food or medicine.  Medicines taken including herbs, eye drops, over-the-counter medicines, and creams.  Use of steroids (by mouth or creams).  Previous problems with anesthetics or numbing medicine.  History of bleeding problems or blood clots.  Previous surgery.  Other health problems, including diabetes and kidney problems.  Possibility of pregnancy, if this applies. RISKS AND COMPLICATIONS  Infection.  Inflammation of the eyeball (endophthalmitis) that can spread to both eyes (sympathetic ophthalmia).  Poor wound healing.  If an IOL is inserted, it can later fall out of proper position. This is very uncommon.  Clouding of the part of your eye that holds an IOL in place. This is called an "after-cataract." These are uncommon but easily treated. BEFORE THE PROCEDURE  Do not eat or drink anything except small amounts of water for 8 to 12 before  your surgery, or as directed by your caregiver.  Unless you are told otherwise, continue any eye drops you have been prescribed.  Talk to your primary caregiver about all other medicines that you take (both prescription and nonprescription). In some cases, you may need to stop or change medicines near the time of your surgery. This is most important if you are taking blood-thinning medicine.Do not stop medicines unless you are told to do so.  Arrange  for someone to drive you to and from the procedure.  Do not put contact lenses in either eye on the day of your surgery. PROCEDURE There is more than one method for safely removing a cataract. Your doctor can explain the differences and help determine which is best for you. Phacoemulsification surgery is the most common form of cataract surgery.  An injection is given behind the eye or eye drops are given to make this a painless procedure.  A small cut (incision) is made on the edge of the clear, dome-shaped surface that covers the front of the eye (cornea).  A tiny probe is painlessly inserted into the eye. This device gives off ultrasound waves that soften and break up the cloudy center of the lens. This makes it easier for the cloudy lens to be removed by suction.  An IOL may be implanted.  The normal lens of the eye is covered by a clear capsule. Part of that capsule is intentionally left in the eye to support the IOL.  Your surgeon may or may not use stitches to close the incision. There are other forms of cataract surgery that require a larger incision and stitches to close the eye. This approach is taken in cases where the doctor feels that the cataract cannot be easily removed using phacoemulsification. AFTER THE PROCEDURE  When an IOL is implanted, it does not need care. It becomes a permanent part of your eye and cannot be seen or felt.  Your doctor will schedule follow-up exams to check on your progress.  Review your other medicines with your doctor to see which can be resumed after surgery.  Use eye drops or take medicine as prescribed by your doctor. Document Released: 02/09/2011 Document Revised: 07/07/2013 Document Reviewed: 02/09/2011 [facility] Patient Information 2015 [place], [place]. This information is not intended to replace advice given to you by your health care provider. Make sure you discuss any questions you have with your health care provider.

## 2014-10-26 ENCOUNTER — Other Ambulatory Visit: Payer: Self-pay

## 2014-10-26 ENCOUNTER — Encounter (HOSPITAL_COMMUNITY)
Admission: RE | Admit: 2014-10-26 | Discharge: 2014-10-26 | Disposition: A | Discharge status: Home / Self Care | Payer: Medicare Other | Source: Ambulatory Visit | Attending: Ophthalmology | Admitting: Ophthalmology

## 2014-10-26 ENCOUNTER — Encounter (HOSPITAL_COMMUNITY): Payer: Self-pay

## 2014-10-26 DIAGNOSIS — H2511 Age-related nuclear cataract, right eye: Secondary | ICD-10-CM | POA: Diagnosis not present

## 2014-10-26 DIAGNOSIS — Z01810 Encounter for preprocedural cardiovascular examination: Secondary | ICD-10-CM | POA: Diagnosis not present

## 2014-10-26 DIAGNOSIS — I10 Essential (primary) hypertension: Secondary | ICD-10-CM | POA: Diagnosis not present

## 2014-10-26 DIAGNOSIS — Z79899 Other long term (current) drug therapy: Secondary | ICD-10-CM | POA: Diagnosis not present

## 2014-10-26 DIAGNOSIS — Z01812 Encounter for preprocedural laboratory examination: Secondary | ICD-10-CM | POA: Diagnosis not present

## 2014-10-26 HISTORY — DX: Unspecified osteoarthritis, unspecified site: M19.90

## 2014-10-26 HISTORY — DX: Essential (primary) hypertension: I10

## 2014-10-26 LAB — CBC
HCT: 45.1 % (ref 39.0–52.0)
HEMOGLOBIN: 14.9 g/dL (ref 13.0–17.0)
MCH: 31.2 pg (ref 26.0–34.0)
MCHC: 33 g/dL (ref 30.0–36.0)
MCV: 94.4 fL (ref 78.0–100.0)
PLATELETS: 184 10*3/uL (ref 150–400)
RBC: 4.78 MIL/uL (ref 4.22–5.81)
RDW: 13.6 % (ref 11.5–15.5)
WBC: 5.2 10*3/uL (ref 4.0–10.5)

## 2014-10-26 LAB — BASIC METABOLIC PANEL
ANION GAP: 7 (ref 5–15)
BUN: 14 mg/dL (ref 6–20)
CHLORIDE: 104 mmol/L (ref 101–111)
CO2: 25 mmol/L (ref 22–32)
Calcium: 9 mg/dL (ref 8.9–10.3)
Creatinine, Ser: 0.52 mg/dL — ABNORMAL LOW (ref 0.61–1.24)
GFR calc Af Amer: >60 mL/min (ref >60)
Glucose, Bld: 113 mg/dL — ABNORMAL HIGH (ref 65–99)
POTASSIUM: 4.3 mmol/L (ref 3.5–5.1)
SODIUM: 136 mmol/L (ref 135–145)

## 2014-10-28 MED ORDER — NEOMYCIN-POLYMYXIN-DEXAMETH 3.5-10000-0.1 OP SUSP
OPHTHALMIC | Status: AC
Start: 2014-10-28 — End: 2014-10-28
  Filled 2014-10-28: qty 5

## 2014-10-28 MED ORDER — LIDOCAINE HCL 3.5 % OP GEL
OPHTHALMIC | Status: AC
  Filled 2014-10-28: qty 1

## 2014-10-28 MED ORDER — PHENYLEPHRINE HCL 2.5 % OP SOLN
OPHTHALMIC | Status: AC
  Filled 2014-10-28: qty 15

## 2014-10-28 MED ORDER — CYCLOPENTOLATE-PHENYLEPHRINE OP SOLN OPTIME - NO CHARGE
OPHTHALMIC | Status: AC
  Filled 2014-10-28: qty 2

## 2014-10-28 MED ORDER — LIDOCAINE HCL (PF) 1 % IJ SOLN
INTRAMUSCULAR | Status: AC
  Filled 2014-10-28: qty 2

## 2014-10-28 MED ORDER — TETRACAINE HCL 0.5 % OP SOLN
OPHTHALMIC | Status: AC
  Filled 2014-10-28: qty 2

## 2014-10-29 ENCOUNTER — Encounter (HOSPITAL_COMMUNITY)
Admission: RE | Disposition: A | Discharge status: Home / Self Care | Payer: Self-pay | Source: Ambulatory Visit | Attending: Ophthalmology

## 2014-10-29 ENCOUNTER — Encounter (HOSPITAL_COMMUNITY): Payer: Self-pay | Admitting: Ophthalmology

## 2014-10-29 ENCOUNTER — Ambulatory Visit (HOSPITAL_COMMUNITY)
Admission: RE | Admit: 2014-10-29 | Discharge: 2014-10-29 | Disposition: A | Discharge status: Home / Self Care | Payer: Medicare Other | Source: Ambulatory Visit | Attending: Ophthalmology | Admitting: Ophthalmology

## 2014-10-29 ENCOUNTER — Ambulatory Visit (HOSPITAL_COMMUNITY): Payer: Medicare Other | Admitting: Anesthesiology

## 2014-10-29 DIAGNOSIS — Z01812 Encounter for preprocedural laboratory examination: Secondary | ICD-10-CM | POA: Insufficient documentation

## 2014-10-29 DIAGNOSIS — Z01810 Encounter for preprocedural cardiovascular examination: Secondary | ICD-10-CM | POA: Diagnosis not present

## 2014-10-29 DIAGNOSIS — I10 Essential (primary) hypertension: Secondary | ICD-10-CM | POA: Diagnosis not present

## 2014-10-29 DIAGNOSIS — Z79899 Other long term (current) drug therapy: Secondary | ICD-10-CM | POA: Diagnosis not present

## 2014-10-29 DIAGNOSIS — H2511 Age-related nuclear cataract, right eye: Secondary | ICD-10-CM | POA: Diagnosis not present

## 2014-10-29 DIAGNOSIS — H269 Unspecified cataract: Secondary | ICD-10-CM | POA: Diagnosis not present

## 2014-10-29 HISTORY — PX: CATARACT EXTRACTION W/PHACO: SHX586

## 2014-10-29 SURGERY — PHACOEMULSIFICATION, CATARACT, WITH IOL INSERTION
Anesthesia: Monitor Anesthesia Care | Site: Eye | Laterality: Right

## 2014-10-29 MED ORDER — PROVISC 10 MG/ML IO SOLN
INTRAOCULAR | Status: DC | PRN
  Administered 2014-10-29: 0.85 mL via INTRAOCULAR

## 2014-10-29 MED ORDER — MIDAZOLAM HCL 2 MG/2ML IJ SOLN
INTRAMUSCULAR | Status: AC
  Filled 2014-10-29: qty 2

## 2014-10-29 MED ORDER — BSS IO SOLN
INTRAOCULAR | Status: DC | PRN
  Administered 2014-10-29: 15 mL

## 2014-10-29 MED ORDER — LIDOCAINE HCL (PF) 1 % IJ SOLN
INTRAMUSCULAR | Status: DC | PRN
  Administered 2014-10-29: .6 mL

## 2014-10-29 MED ORDER — MIDAZOLAM HCL 2 MG/2ML IJ SOLN
1.0000 mg | INTRAMUSCULAR | Status: DC | PRN
  Administered 2014-10-29: 2 mg via INTRAVENOUS

## 2014-10-29 MED ORDER — EPINEPHRINE HCL 1 MG/ML IJ SOLN
INTRAOCULAR | Status: DC | PRN
  Administered 2014-10-29: 500 mL

## 2014-10-29 MED ORDER — PHENYLEPHRINE HCL 2.5 % OP SOLN
1.0000 [drp] | OPHTHALMIC | Status: AC
  Administered 2014-10-29 (×3): 1 [drp] via OPHTHALMIC

## 2014-10-29 MED ORDER — FENTANYL CITRATE (PF) 100 MCG/2ML IJ SOLN
25.0000 ug | INTRAMUSCULAR | Status: AC
  Administered 2014-10-29 (×2): 25 ug via INTRAVENOUS

## 2014-10-29 MED ORDER — NEOMYCIN-POLYMYXIN-DEXAMETH 3.5-10000-0.1 OP SUSP
OPHTHALMIC | Status: DC | PRN
  Administered 2014-10-29: 2 [drp] via OPHTHALMIC

## 2014-10-29 MED ORDER — LIDOCAINE HCL 3.5 % OP GEL
1.0000 "application " | Freq: Once | OPHTHALMIC | Status: AC
  Administered 2014-10-29: 1 via OPHTHALMIC

## 2014-10-29 MED ORDER — LACTATED RINGERS IV SOLN
INTRAVENOUS | Status: DC
  Administered 2014-10-29: 1000 mL via INTRAVENOUS

## 2014-10-29 MED ORDER — TETRACAINE HCL 0.5 % OP SOLN
1.0000 [drp] | OPHTHALMIC | Status: AC
  Administered 2014-10-29 (×3): 1 [drp] via OPHTHALMIC

## 2014-10-29 MED ORDER — FENTANYL CITRATE (PF) 100 MCG/2ML IJ SOLN
INTRAMUSCULAR | Status: AC
  Filled 2014-10-29: qty 2

## 2014-10-29 MED ORDER — POVIDONE-IODINE 5 % OP SOLN
OPHTHALMIC | Status: DC | PRN
  Administered 2014-10-29: 1 via OPHTHALMIC

## 2014-10-29 MED ORDER — CYCLOPENTOLATE-PHENYLEPHRINE 0.2-1 % OP SOLN
1.0000 [drp] | OPHTHALMIC | Status: AC
  Administered 2014-10-29 (×3): 1 [drp] via OPHTHALMIC

## 2014-10-29 SURGICAL SUPPLY — 13 items
CLOTH BEACON ORANGE TIMEOUT ST (SAFETY) ×3 IMPLANT
EYE SHIELD UNIVERSAL CLEAR (GAUZE/BANDAGES/DRESSINGS) ×3 IMPLANT
GLOVE BIOGEL PI IND STRL 6.5 (GLOVE) ×1 IMPLANT
GLOVE BIOGEL PI INDICATOR 6.5 (GLOVE) ×2
GLOVE EXAM NITRILE MD LF STRL (GLOVE) ×3 IMPLANT
LENS INTRAOCULAR RESTOR ×3 IMPLANT
PAD ARMBOARD 7.5X6 YLW CONV (MISCELLANEOUS) ×3 IMPLANT
PROC W SPEC LENS (INTRAOCULAR LENS) ×3
PROCESS W SPEC LENS (INTRAOCULAR LENS) ×1 IMPLANT
SYRINGE LUER LOK 1CC (MISCELLANEOUS) ×3 IMPLANT
TAPE SURG TRANSPORE 1 IN (GAUZE/BANDAGES/DRESSINGS) ×1 IMPLANT
TAPE SURGICAL TRANSPORE 1 IN (GAUZE/BANDAGES/DRESSINGS) ×2
WATER STERILE IRR 250ML POUR (IV SOLUTION) ×3 IMPLANT

## 2014-10-29 NOTE — Discharge Instructions (Signed)

## 2014-10-29 NOTE — Transfer of Care (Signed)
Immediate Anesthesia Transfer of Care Note  Patient: Douglas Rasmussen  Procedure(s) Performed: Procedure(s): CATARACT EXTRACTION PHACO AND INTRAOCULAR LENS PLACEMENT RIGHT EYE CDE=6.86 (Right)  Patient Location: Short Stay  Anesthesia Type:MAC  Level of Consciousness: awake, alert , oriented and patient cooperative  Airway & Oxygen Therapy: Patient Spontanous Breathing  Post-op Assessment: Report given to RN, Post -op Vital signs reviewed and stable and Patient moving all extremities  Post vital signs: Reviewed and stable  Last Vitals:  Filed Vitals:   10/29/14 0954  BP: 97/60  Pulse: 72  Temp: 36.7 C  Resp: 18    Complications: No apparent anesthesia complications

## 2014-10-29 NOTE — Anesthesia Postprocedure Evaluation (Signed)
  Anesthesia Post-op Note  Patient: Douglas Rasmussen  Procedure(s) Performed: Procedure(s): CATARACT EXTRACTION PHACO AND INTRAOCULAR LENS PLACEMENT RIGHT EYE CDE=6.86 (Right)  Patient Location: Short Stay  Anesthesia Type:MAC  Level of Consciousness: awake, alert , oriented and patient cooperative  Airway and Oxygen Therapy: Patient Spontanous Breathing  Post-op Pain: none  Post-op Assessment: Post-op Vital signs reviewed, Patient's Cardiovascular Status Stable, Respiratory Function Stable, Patent Airway, Adequate PO intake and Pain level controlled              Post-op Vital Signs: Reviewed and stable  Last Vitals:  Filed Vitals:   10/29/14 0954  BP: 97/60  Pulse: 72  Temp: 36.7 C  Resp: 18    Complications: No apparent anesthesia complications

## 2014-10-29 NOTE — Op Note (Signed)
Date of Admission: 10/29/2014  Date of Surgery: 10/29/2014   Pre-Op Dx: Cataract Right Eye  Post-Op Dx: Senile Nuclear Cataract Right  Eye,  Dx Code H25.11  Surgeon: Tonny Branch, M.D.  Assistants: None  Anesthesia: Topical with MAC  Indications: Painless, progressive loss of vision with compromise of daily activities.  Surgery: Cataract Extraction with Intraocular lens Implant Right Eye  Discription: The patient had dilating drops and viscous lidocaine placed into the Right eye in the pre-op holding area. After transfer to the operating room, a time out was performed. The patient was then prepped and draped. Beginning with a 99 degree blade a paracentesis port was made at the surgeon's 2 o'clock position. The anterior chamber was then filled with 1% non-preserved lidocaine. This was followed by filling the anterior chamber with Provisc.  A 2.55mm keratome blade was used to make a clear corneal incision at the temporal limbus.  A bent cystatome needle was used to create a continuous tear capsulotomy. Hydrodissection was performed with balanced salt solution on a Fine canula. The lens nucleus was then removed using the phacoemulsification handpiece. Residual cortex was removed with the I&A handpiece. The anterior chamber and capsular bag were refilled with Provisc. A posterior chamber intraocular lens was placed into the capsular bag with it's injector. The implant was positioned with the Kuglan hook. The Provisc was then removed from the anterior chamber and capsular bag with the I&A handpiece. Stromal hydration of the main incision and paracentesis port was performed with BSS on a Fine canula. The wounds were tested for leak which was negative. The patient tolerated the procedure well. There were no operative complications. The patient was then transferred to the recovery room in stable condition.  Complications: None  Specimen: None  EBL: None  Prosthetic device: Alcon ReStor [id number], power  22.0 D, SN [id number].

## 2014-10-29 NOTE — Anesthesia Preprocedure Evaluation (Signed)
Anesthesia Evaluation  Patient identified by MRN, date of birth, ID band Patient awake    Reviewed: Allergy & Precautions, NPO status , Patient's Chart, lab work & pertinent test results  Airway Mallampati: II  TM Distance: >3 FB     Dental  (+) Teeth Intact, Implants   Pulmonary neg pulmonary ROS,  breath sounds clear to auscultation        Cardiovascular hypertension, Pt. on medications Rhythm:Regular Rate:Normal     Neuro/Psych    GI/Hepatic negative GI ROS,   Endo/Other    Renal/GU      Musculoskeletal   Abdominal   Peds  Hematology   Anesthesia Other Findings   Reproductive/Obstetrics                             Anesthesia Physical Anesthesia Plan  ASA: II  Anesthesia Plan: MAC   Post-op Pain Management:    Induction: Intravenous  Airway Management Planned: Nasal Cannula  Additional Equipment:   Intra-op Plan:   Post-operative Plan:   Informed Consent: I have reviewed the patients History and Physical, chart, labs and discussed the procedure including the risks, benefits and alternatives for the proposed anesthesia with the patient or authorized representative who has indicated his/her understanding and acceptance.     Plan Discussed with:   Anesthesia Plan Comments:         Anesthesia Quick Evaluation

## 2014-10-29 NOTE — H&P (Signed)
I have reviewed the H&P, the patient was re-examined, and I have identified no interval changes in medical condition and plan of care since the history and physical of record  

## 2014-10-30 ENCOUNTER — Encounter (HOSPITAL_COMMUNITY): Payer: Self-pay | Admitting: Ophthalmology

## 2014-11-12 DIAGNOSIS — H5202 Hypermetropia, left eye: Secondary | ICD-10-CM | POA: Diagnosis not present

## 2014-11-12 DIAGNOSIS — H5231 Anisometropia: Secondary | ICD-10-CM | POA: Diagnosis not present

## 2014-11-12 DIAGNOSIS — H2512 Age-related nuclear cataract, left eye: Secondary | ICD-10-CM | POA: Diagnosis not present

## 2014-11-12 DIAGNOSIS — Z961 Presence of intraocular lens: Secondary | ICD-10-CM | POA: Diagnosis not present

## 2014-11-20 ENCOUNTER — Encounter (HOSPITAL_COMMUNITY)
Admission: RE | Admit: 2014-11-20 | Discharge: 2014-11-20 | Disposition: A | Discharge status: Home / Self Care | Payer: Medicare Other | Source: Ambulatory Visit | Attending: Ophthalmology | Admitting: Ophthalmology

## 2014-11-25 MED ORDER — TETRACAINE HCL 0.5 % OP SOLN
OPHTHALMIC | Status: AC
  Filled 2014-11-25: qty 2

## 2014-11-25 MED ORDER — LIDOCAINE HCL (PF) 1 % IJ SOLN
INTRAMUSCULAR | Status: AC
  Filled 2014-11-25: qty 2

## 2014-11-25 MED ORDER — NEOMYCIN-POLYMYXIN-DEXAMETH 3.5-10000-0.1 OP SUSP
OPHTHALMIC | Status: AC
  Filled 2014-11-25: qty 5

## 2014-11-25 MED ORDER — CYCLOPENTOLATE-PHENYLEPHRINE OP SOLN OPTIME - NO CHARGE
OPHTHALMIC | Status: AC
  Filled 2014-11-25: qty 2

## 2014-11-25 MED ORDER — PHENYLEPHRINE HCL 2.5 % OP SOLN
OPHTHALMIC | Status: AC
  Filled 2014-11-25: qty 15

## 2014-11-25 MED ORDER — LIDOCAINE HCL 3.5 % OP GEL
OPHTHALMIC | Status: AC
  Filled 2014-11-25: qty 1

## 2014-11-26 ENCOUNTER — Encounter (HOSPITAL_COMMUNITY): Payer: Self-pay | Admitting: *Deleted

## 2014-11-26 ENCOUNTER — Ambulatory Visit (HOSPITAL_COMMUNITY): Payer: Medicare Other | Admitting: Anesthesiology

## 2014-11-26 ENCOUNTER — Ambulatory Visit (HOSPITAL_COMMUNITY)
Admission: RE | Admit: 2014-11-26 | Discharge: 2014-11-26 | Disposition: A | Discharge status: Home / Self Care | Payer: Medicare Other | Source: Ambulatory Visit | Attending: Ophthalmology | Admitting: Ophthalmology

## 2014-11-26 ENCOUNTER — Encounter (HOSPITAL_COMMUNITY)
Admission: RE | Disposition: A | Discharge status: Home / Self Care | Payer: Self-pay | Source: Ambulatory Visit | Attending: Ophthalmology

## 2014-11-26 DIAGNOSIS — Z79899 Other long term (current) drug therapy: Secondary | ICD-10-CM | POA: Insufficient documentation

## 2014-11-26 DIAGNOSIS — Z7951 Long term (current) use of inhaled steroids: Secondary | ICD-10-CM | POA: Insufficient documentation

## 2014-11-26 DIAGNOSIS — H2512 Age-related nuclear cataract, left eye: Secondary | ICD-10-CM | POA: Diagnosis not present

## 2014-11-26 DIAGNOSIS — Z96649 Presence of unspecified artificial hip joint: Secondary | ICD-10-CM | POA: Diagnosis not present

## 2014-11-26 DIAGNOSIS — I10 Essential (primary) hypertension: Secondary | ICD-10-CM | POA: Diagnosis not present

## 2014-11-26 DIAGNOSIS — H269 Unspecified cataract: Secondary | ICD-10-CM | POA: Diagnosis not present

## 2014-11-26 HISTORY — PX: CATARACT EXTRACTION W/PHACO: SHX586

## 2014-11-26 SURGERY — PHACOEMULSIFICATION, CATARACT, WITH IOL INSERTION
Anesthesia: Monitor Anesthesia Care | Site: Eye | Laterality: Left

## 2014-11-26 MED ORDER — TETRACAINE HCL 0.5 % OP SOLN
1.0000 [drp] | OPHTHALMIC | Status: AC
  Administered 2014-11-26 (×3): 1 [drp] via OPHTHALMIC

## 2014-11-26 MED ORDER — BSS IO SOLN
INTRAOCULAR | Status: DC | PRN
  Administered 2014-11-26: 500 mL

## 2014-11-26 MED ORDER — EPINEPHRINE HCL 1 MG/ML IJ SOLN
INTRAMUSCULAR | Status: AC
  Filled 2014-11-26: qty 1

## 2014-11-26 MED ORDER — POVIDONE-IODINE 5 % OP SOLN
OPHTHALMIC | Status: DC | PRN
  Administered 2014-11-26: 1 via OPHTHALMIC

## 2014-11-26 MED ORDER — LACTATED RINGERS IV SOLN
INTRAVENOUS | Status: DC
  Administered 2014-11-26: 07:00:00 via INTRAVENOUS

## 2014-11-26 MED ORDER — LIDOCAINE HCL 3.5 % OP GEL
1.0000 "application " | Freq: Once | OPHTHALMIC | Status: AC
  Administered 2014-11-26: 1 via OPHTHALMIC

## 2014-11-26 MED ORDER — PHENYLEPHRINE HCL 2.5 % OP SOLN
1.0000 [drp] | OPHTHALMIC | Status: AC
  Administered 2014-11-26 (×3): 1 [drp] via OPHTHALMIC

## 2014-11-26 MED ORDER — LIDOCAINE HCL (PF) 1 % IJ SOLN
INTRAMUSCULAR | Status: DC | PRN
  Administered 2014-11-26: .4 mL

## 2014-11-26 MED ORDER — FENTANYL CITRATE (PF) 100 MCG/2ML IJ SOLN
INTRAMUSCULAR | Status: AC
  Filled 2014-11-26: qty 2

## 2014-11-26 MED ORDER — LIDOCAINE 3.5 % OP GEL OPTIME - NO CHARGE
OPHTHALMIC | Status: DC | PRN
  Administered 2014-11-26: 1 [drp] via OPHTHALMIC

## 2014-11-26 MED ORDER — FENTANYL CITRATE (PF) 100 MCG/2ML IJ SOLN
25.0000 ug | INTRAMUSCULAR | Status: AC
  Administered 2014-11-26: 25 ug via INTRAVENOUS

## 2014-11-26 MED ORDER — NEOMYCIN-POLYMYXIN-DEXAMETH 3.5-10000-0.1 OP SUSP
OPHTHALMIC | Status: DC | PRN
  Administered 2014-11-26: 1 [drp] via OPHTHALMIC

## 2014-11-26 MED ORDER — MIDAZOLAM HCL 2 MG/2ML IJ SOLN
INTRAMUSCULAR | Status: AC
  Filled 2014-11-26: qty 2

## 2014-11-26 MED ORDER — BSS IO SOLN
INTRAOCULAR | Status: DC | PRN
  Administered 2014-11-26: 15 mL

## 2014-11-26 MED ORDER — CYCLOPENTOLATE-PHENYLEPHRINE 0.2-1 % OP SOLN
1.0000 [drp] | OPHTHALMIC | Status: AC
  Administered 2014-11-26 (×3): 1 [drp] via OPHTHALMIC

## 2014-11-26 MED ORDER — MIDAZOLAM HCL 2 MG/2ML IJ SOLN
1.0000 mg | INTRAMUSCULAR | Status: DC | PRN
Start: 2014-11-26 — End: 2014-11-26
  Administered 2014-11-26: 2 mg via INTRAVENOUS

## 2014-11-26 MED ORDER — PROVISC 10 MG/ML IO SOLN
INTRAOCULAR | Status: DC | PRN
  Administered 2014-11-26: 0.85 mL via INTRAOCULAR

## 2014-11-26 SURGICAL SUPPLY — 12 items
CLOTH BEACON ORANGE TIMEOUT ST (SAFETY) ×3 IMPLANT
EYE SHIELD UNIVERSAL CLEAR (GAUZE/BANDAGES/DRESSINGS) ×3 IMPLANT
GLOVE BIOGEL PI IND STRL 7.0 (GLOVE) ×1 IMPLANT
GLOVE BIOGEL PI INDICATOR 7.0 (GLOVE) ×2
LENS INTRAOCULAR RESTOR ×3 IMPLANT
PAD ARMBOARD 7.5X6 YLW CONV (MISCELLANEOUS) ×3 IMPLANT
PROC W SPEC LENS (INTRAOCULAR LENS) ×3
PROCESS W SPEC LENS (INTRAOCULAR LENS) ×1 IMPLANT
SYRINGE LUER LOK 1CC (MISCELLANEOUS) ×3 IMPLANT
TAPE SURG TRANSPORE 1 IN (GAUZE/BANDAGES/DRESSINGS) ×1 IMPLANT
TAPE SURGICAL TRANSPORE 1 IN (GAUZE/BANDAGES/DRESSINGS) ×2
WATER STERILE IRR 250ML POUR (IV SOLUTION) ×3 IMPLANT

## 2014-11-26 NOTE — H&P (Signed)
I have reviewed the H&P, the patient was re-examined, and I have identified no interval changes in medical condition and plan of care since the history and physical of record  

## 2014-11-26 NOTE — Anesthesia Preprocedure Evaluation (Signed)
Anesthesia Evaluation  Patient identified by MRN, date of birth, ID band Patient awake    Reviewed: Allergy & Precautions, NPO status , Patient's Chart, lab work & pertinent test results  Airway Mallampati: II  TM Distance: >3 FB     Dental  (+) Teeth Intact, Implants   Pulmonary neg pulmonary ROS,  breath sounds clear to auscultation        Cardiovascular hypertension, Pt. on medications Rhythm:Regular Rate:Normal     Neuro/Psych    GI/Hepatic negative GI ROS,   Endo/Other    Renal/GU      Musculoskeletal   Abdominal   Peds  Hematology   Anesthesia Other Findings   Reproductive/Obstetrics                             Anesthesia Physical Anesthesia Plan  ASA: II  Anesthesia Plan: MAC   Post-op Pain Management:    Induction: Intravenous  Airway Management Planned: Nasal Cannula  Additional Equipment:   Intra-op Plan:   Post-operative Plan:   Informed Consent: I have reviewed the patients History and Physical, chart, labs and discussed the procedure including the risks, benefits and alternatives for the proposed anesthesia with the patient or authorized representative who has indicated his/her understanding and acceptance.     Plan Discussed with:   Anesthesia Plan Comments:         Anesthesia Quick Evaluation  

## 2014-11-26 NOTE — Discharge Instructions (Signed)

## 2014-11-26 NOTE — Transfer of Care (Signed)
Immediate Anesthesia Transfer of Care Note  Patient: Douglas Rasmussen  Procedure(s) Performed: Procedure(s) with comments: CATARACT EXTRACTION PHACO AND INTRAOCULAR LENS PLACEMENT (IOC) (Left) - CDE:5.33  Patient Location: Short Stay  Anesthesia Type:MAC  Level of Consciousness: awake, alert , oriented and patient cooperative  Airway & Oxygen Therapy: Patient Spontanous Breathing  Post-op Assessment: Report given to RN, Post -op Vital signs reviewed and stable and Patient moving all extremities  Post vital signs: Reviewed and stable  Last Vitals:  Filed Vitals:   11/26/14 0710  BP: 110/74  Temp:   Resp: 0    Complications: No apparent anesthesia complications

## 2014-11-26 NOTE — Anesthesia Postprocedure Evaluation (Signed)
  Anesthesia Post-op Note  Patient: Douglas Rasmussen  Procedure(s) Performed: Procedure(s) with comments: CATARACT EXTRACTION PHACO AND INTRAOCULAR LENS PLACEMENT (IOC) (Left) - CDE:5.33  Patient Location: Short Stay  Anesthesia Type:MAC  Level of Consciousness: awake, alert , oriented and patient cooperative  Airway and Oxygen Therapy: Patient Spontanous Breathing  Post-op Pain: none  Post-op Assessment: Post-op Vital signs reviewed, Patient's Cardiovascular Status Stable, Respiratory Function Stable, Patent Airway, No signs of Nausea or vomiting, Adequate PO intake and Pain level controlled              Post-op Vital Signs: Reviewed and stable  Last Vitals:  Filed Vitals:   11/26/14 0710  BP: 110/74  Temp:   Resp: 0    Complications: No apparent anesthesia complications

## 2014-11-26 NOTE — Op Note (Signed)
Date of Admission: 11/26/2014  Date of Surgery: 11/26/2014   Pre-Op Dx: Cataract Left Eye  Post-Op Dx: Senile Nuclear Cataract Left  Eye,  Dx Code H25.12  Surgeon: Tonny Branch, M.D.  Assistants: None  Anesthesia: Topical with MAC  Indications: Painless, progressive loss of vision with compromise of daily activities.  Surgery: Cataract Extraction with Intraocular lens Implant Left Eye  Discription: The patient had dilating drops and viscous lidocaine placed into the Left eye in the pre-op holding area. After transfer to the operating room, a time out was performed. The patient was then prepped and draped. Beginning with a 22 degree blade a paracentesis port was made at the surgeon's 2 o'clock position. The anterior chamber was then filled with 1% non-preserved lidocaine. This was followed by filling the anterior chamber with Provisc.  A 2.61mm keratome blade was used to make a clear corneal incision at the temporal limbus.  A bent cystatome needle was used to create a continuous tear capsulotomy. Hydrodissection was performed with balanced salt solution on a Fine canula. The lens nucleus was then removed using the phacoemulsification handpiece. Residual cortex was removed with the I&A handpiece. The anterior chamber and capsular bag were refilled with Provisc. A posterior chamber intraocular lens was placed into the capsular bag with it's injector. The implant was positioned with the Kuglan hook. The Provisc was then removed from the anterior chamber and capsular bag with the I&A handpiece. Stromal hydration of the main incision and paracentesis port was performed with BSS on a Fine canula. The wounds were tested for leak which was negative. The patient tolerated the procedure well. There were no operative complications. The patient was then transferred to the recovery room in stable condition.  Complications: None  Specimen: None  EBL: None  Prosthetic device: Alcon [id number], power 22.5 D, SN  [id number].

## 2014-11-27 ENCOUNTER — Encounter (HOSPITAL_COMMUNITY): Payer: Self-pay | Admitting: Ophthalmology

## 2015-01-18 DIAGNOSIS — E782 Mixed hyperlipidemia: Secondary | ICD-10-CM | POA: Diagnosis not present

## 2015-01-18 DIAGNOSIS — N281 Cyst of kidney, acquired: Secondary | ICD-10-CM | POA: Diagnosis not present

## 2015-01-18 DIAGNOSIS — M1991 Primary osteoarthritis, unspecified site: Secondary | ICD-10-CM | POA: Diagnosis not present

## 2015-01-18 DIAGNOSIS — R7301 Impaired fasting glucose: Secondary | ICD-10-CM | POA: Diagnosis not present

## 2015-01-18 DIAGNOSIS — I10 Essential (primary) hypertension: Secondary | ICD-10-CM | POA: Diagnosis not present

## 2015-01-18 DIAGNOSIS — N411 Chronic prostatitis: Secondary | ICD-10-CM | POA: Diagnosis not present

## 2015-01-18 DIAGNOSIS — F17220 Nicotine dependence, chewing tobacco, uncomplicated: Secondary | ICD-10-CM | POA: Diagnosis not present

## 2015-01-18 DIAGNOSIS — D126 Benign neoplasm of colon, unspecified: Secondary | ICD-10-CM | POA: Diagnosis not present

## 2015-01-19 DIAGNOSIS — E782 Mixed hyperlipidemia: Secondary | ICD-10-CM | POA: Diagnosis not present

## 2015-01-26 DIAGNOSIS — E782 Mixed hyperlipidemia: Secondary | ICD-10-CM | POA: Diagnosis not present

## 2015-01-26 DIAGNOSIS — Z23 Encounter for immunization: Secondary | ICD-10-CM | POA: Diagnosis not present

## 2015-01-26 DIAGNOSIS — M19011 Primary osteoarthritis, right shoulder: Secondary | ICD-10-CM | POA: Diagnosis not present

## 2015-01-26 DIAGNOSIS — Z0001 Encounter for general adult medical examination with abnormal findings: Secondary | ICD-10-CM | POA: Diagnosis not present

## 2015-01-26 DIAGNOSIS — D126 Benign neoplasm of colon, unspecified: Secondary | ICD-10-CM | POA: Diagnosis not present

## 2015-01-26 DIAGNOSIS — M1612 Unilateral primary osteoarthritis, left hip: Secondary | ICD-10-CM | POA: Diagnosis not present

## 2015-01-26 DIAGNOSIS — N200 Calculus of kidney: Secondary | ICD-10-CM | POA: Diagnosis not present

## 2015-01-26 DIAGNOSIS — F17220 Nicotine dependence, chewing tobacco, uncomplicated: Secondary | ICD-10-CM | POA: Diagnosis not present

## 2015-01-26 DIAGNOSIS — I10 Essential (primary) hypertension: Secondary | ICD-10-CM | POA: Diagnosis not present

## 2015-01-26 DIAGNOSIS — R7301 Impaired fasting glucose: Secondary | ICD-10-CM | POA: Diagnosis not present

## 2015-01-26 DIAGNOSIS — Z96642 Presence of left artificial hip joint: Secondary | ICD-10-CM | POA: Diagnosis not present

## 2015-01-26 DIAGNOSIS — G4709 Other insomnia: Secondary | ICD-10-CM | POA: Diagnosis not present

## 2015-05-21 DIAGNOSIS — Z96642 Presence of left artificial hip joint: Secondary | ICD-10-CM | POA: Diagnosis not present

## 2015-05-21 DIAGNOSIS — M4807 Spinal stenosis, lumbosacral region: Secondary | ICD-10-CM | POA: Diagnosis not present

## 2015-05-21 DIAGNOSIS — Z471 Aftercare following joint replacement surgery: Secondary | ICD-10-CM | POA: Diagnosis not present

## 2015-06-30 DIAGNOSIS — I10 Essential (primary) hypertension: Secondary | ICD-10-CM | POA: Diagnosis not present

## 2015-06-30 DIAGNOSIS — R7301 Impaired fasting glucose: Secondary | ICD-10-CM | POA: Diagnosis not present

## 2015-06-30 DIAGNOSIS — F17220 Nicotine dependence, chewing tobacco, uncomplicated: Secondary | ICD-10-CM | POA: Diagnosis not present

## 2015-06-30 DIAGNOSIS — E782 Mixed hyperlipidemia: Secondary | ICD-10-CM | POA: Diagnosis not present

## 2015-06-30 DIAGNOSIS — D126 Benign neoplasm of colon, unspecified: Secondary | ICD-10-CM | POA: Diagnosis not present

## 2015-07-07 DIAGNOSIS — M1991 Primary osteoarthritis, unspecified site: Secondary | ICD-10-CM | POA: Diagnosis not present

## 2015-07-07 DIAGNOSIS — E782 Mixed hyperlipidemia: Secondary | ICD-10-CM | POA: Diagnosis not present

## 2015-07-07 DIAGNOSIS — R7301 Impaired fasting glucose: Secondary | ICD-10-CM | POA: Diagnosis not present

## 2015-07-07 DIAGNOSIS — M19011 Primary osteoarthritis, right shoulder: Secondary | ICD-10-CM | POA: Diagnosis not present

## 2015-07-07 DIAGNOSIS — D126 Benign neoplasm of colon, unspecified: Secondary | ICD-10-CM | POA: Diagnosis not present

## 2015-07-07 DIAGNOSIS — Z1389 Encounter for screening for other disorder: Secondary | ICD-10-CM | POA: Diagnosis not present

## 2015-07-07 DIAGNOSIS — I10 Essential (primary) hypertension: Secondary | ICD-10-CM | POA: Diagnosis not present

## 2015-07-07 DIAGNOSIS — F17220 Nicotine dependence, chewing tobacco, uncomplicated: Secondary | ICD-10-CM | POA: Diagnosis not present

## 2015-07-09 DIAGNOSIS — K760 Fatty (change of) liver, not elsewhere classified: Secondary | ICD-10-CM | POA: Diagnosis not present

## 2015-11-24 DIAGNOSIS — M19011 Primary osteoarthritis, right shoulder: Secondary | ICD-10-CM | POA: Diagnosis not present

## 2015-12-05 DIAGNOSIS — Z9289 Personal history of other medical treatment: Secondary | ICD-10-CM

## 2015-12-05 HISTORY — DX: Personal history of other medical treatment: Z92.89

## 2015-12-06 DIAGNOSIS — E669 Obesity, unspecified: Secondary | ICD-10-CM | POA: Diagnosis not present

## 2015-12-06 DIAGNOSIS — E785 Hyperlipidemia, unspecified: Secondary | ICD-10-CM | POA: Diagnosis not present

## 2015-12-06 DIAGNOSIS — R079 Chest pain, unspecified: Secondary | ICD-10-CM | POA: Diagnosis not present

## 2015-12-06 DIAGNOSIS — I10 Essential (primary) hypertension: Secondary | ICD-10-CM | POA: Diagnosis not present

## 2015-12-06 DIAGNOSIS — Z01810 Encounter for preprocedural cardiovascular examination: Secondary | ICD-10-CM | POA: Diagnosis not present

## 2016-01-06 ENCOUNTER — Encounter (HOSPITAL_COMMUNITY)
Admission: RE | Admit: 2016-01-06 | Discharge: 2016-01-06 | Disposition: A | Discharge status: Home / Self Care | Payer: Medicare Other | Source: Ambulatory Visit | Attending: Orthopedic Surgery | Admitting: Orthopedic Surgery

## 2016-01-06 ENCOUNTER — Encounter (HOSPITAL_COMMUNITY): Payer: Self-pay

## 2016-01-06 DIAGNOSIS — Z01818 Encounter for other preprocedural examination: Secondary | ICD-10-CM | POA: Insufficient documentation

## 2016-01-06 DIAGNOSIS — M19011 Primary osteoarthritis, right shoulder: Secondary | ICD-10-CM | POA: Insufficient documentation

## 2016-01-06 HISTORY — DX: Personal history of urinary calculi: Z87.442

## 2016-01-06 HISTORY — DX: Personal history of other medical treatment: Z92.89

## 2016-01-06 HISTORY — DX: Prediabetes: R73.03

## 2016-01-06 LAB — BASIC METABOLIC PANEL
ANION GAP: 8 (ref 5–15)
BUN: 14 mg/dL (ref 6–20)
CHLORIDE: 107 mmol/L (ref 101–111)
CO2: 24 mmol/L (ref 22–32)
Calcium: 9.3 mg/dL (ref 8.9–10.3)
Creatinine, Ser: 0.54 mg/dL — ABNORMAL LOW (ref 0.61–1.24)
GFR calc non Af Amer: >60 mL/min (ref >60)
GLUCOSE: 110 mg/dL — AB (ref 65–99)
Potassium: 4.3 mmol/L (ref 3.5–5.1)
Sodium: 139 mmol/L (ref 135–145)

## 2016-01-06 LAB — CBC
HCT: 45.6 % (ref 39.0–52.0)
HEMOGLOBIN: 14.7 g/dL (ref 13.0–17.0)
MCH: 30.2 pg (ref 26.0–34.0)
MCHC: 32.2 g/dL (ref 30.0–36.0)
MCV: 93.6 fL (ref 78.0–100.0)
Platelets: 186 10*3/uL (ref 150–400)
RBC: 4.87 MIL/uL (ref 4.22–5.81)
RDW: 13.5 % (ref 11.5–15.5)
WBC: 5.3 10*3/uL (ref 4.0–10.5)

## 2016-01-06 LAB — SURGICAL PCR SCREEN
MRSA, PCR: NEGATIVE
Staphylococcus aureus: NEGATIVE

## 2016-01-06 NOTE — Pre-Procedure Instructions (Signed)
SAFI RYKER  01/06/2016      [facility] [address], [place] [address] Phone: [phone] Fax: [phone]    Your procedure is scheduled on 01/13/2016  Report to [facility] Admitting at 8:00 A.M.  Call this number if you have problems the morning of surgery:  [phone]   Remember:  Do not eat food or drink liquids after midnight.   On WEDNESDAY  Take these medicines the morning of surgery with A SIP OF WATER : NOTHING   Do not wear jewelry   Do not wear lotions, powders, or perfumes, or deoderant.      Men may shave face and neck.   Do not bring valuables to the hospital.   [facility] is not responsible for any belongings or valuables.  Contacts, dentures or bridgework may not be worn into surgery.  Leave your suitcase in the car.  After surgery it may be brought to your room.  For patients admitted to the hospital, discharge time will be determined by your treatment team.  Patients discharged the day of surgery will not be allowed to drive home.   Name and phone number of your driver:   Wife - Mrs. Shurley   Special instructions:  Special Instructions: Welcome - Preparing for Surgery  Before surgery, you can play an important role.  Because skin is not sterile, your skin needs to be as free of germs as possible.  You can reduce the number of germs on you skin by washing with CHG (chlorahexidine gluconate) soap before surgery.  CHG is an antiseptic cleaner which kills germs and bonds with the skin to continue killing germs even after washing.  Please DO NOT use if you have an allergy to CHG or antibacterial soaps.  If your skin becomes reddened/irritated stop using the CHG and inform your nurse when you arrive at Short Stay.  Do not shave (including legs and underarms) for at least 48 hours prior to the first CHG shower.  You may shave your face.  Please follow these instructions carefully:   1.   Shower with CHG Soap the night before surgery and the  morning of Surgery.  2.  If you choose to wash your hair, wash your hair first as usual with your  normal shampoo.  3.  After you shampoo, rinse your hair and body thoroughly to remove the  Shampoo.  4.  Use CHG as you would any other liquid soap.  You can apply chg directly to the skin and wash gently with scrungie or a clean washcloth.  5.  Apply the CHG Soap to your body ONLY FROM THE NECK DOWN.    Do not use on open wounds or open sores.  Avoid contact with your eyes, ears, mouth and genitals (private parts).  Wash genitals (private parts)   with your normal soap.  6.  Wash thoroughly, paying special attention to the area where your surgery will be performed.  7.  Thoroughly rinse your body with warm water from the neck down.  8.  DO NOT shower/wash with your normal soap after using and rinsing off   the CHG Soap.  9.  Pat yourself dry with a clean towel.            10.  Wear clean pajamas.            11.  Place clean sheets on your  bed the night of your first shower and do not sleep with pets.  Day of Surgery  Do not apply any lotions/deodorants the morning of surgery.  Please wear clean clothes to the hospital/surgery center.  Please read over the following fact sheets that you were given. Pain Booklet, Coughing and Deep Breathing, MRSA Information and Surgical Site Infection Prevention

## 2016-01-06 NOTE — Progress Notes (Signed)
Pt. Reports that there has been some careful watching of his recent HgbA1c by the PCP- Dr. Pleas Koch at [place]. He reports the last one was done 6 months. Ago- tracking at 5.7-6.3 in the past yr.   He . Is regulating his diet , has been taking Cinnamon & also a "blood sugar regulator", per the box he carried with him but the ingredients are the exact same as a MVT. He admits that he drinks as many as 60 - 12oz cans of beer per week. Precautionary- stress test was completed in 12/2015, at [facility], results on the  [place] chart.  Pt. Will need EKG DOS, requested from [facility] but they don't claim to have one as the pt. Thought.

## 2016-01-06 NOTE — Progress Notes (Signed)
   01/06/16 1103  OBSTRUCTIVE SLEEP APNEA  Have you ever been diagnosed with sleep apnea through a sleep study? No  Do you snore loudly (loud enough to be heard through closed doors)?  0  Do you often feel tired, fatigued, or sleepy during the daytime (such as falling asleep during driving or talking to someone)? 1 (not sleeping well at night due to pain)  Has anyone observed you stop breathing during your sleep? 0  Do you have, or are you being treated for high blood pressure? 1  BMI more than 35 kg/m2? 0  Age > 50 (1-yes) 1  Neck circumference greater than:Male 16 inches or larger, Male 17inches or larger? 1  Male Gender (Yes=1) 1  Obstructive Sleep Apnea Score 5  Score 5 or greater  Results sent to PCP

## 2016-01-07 LAB — HEMOGLOBIN A1C
Hgb A1c MFr Bld: 6.1 % — ABNORMAL HIGH (ref 4.8–5.6)
MEAN PLASMA GLUCOSE: 128 mg/dL

## 2016-01-07 NOTE — Progress Notes (Signed)
Anesthesia Chart Review:  Pt is a 70 year old male scheduled for R total shoulder arthroplasty on 01/13/2016 with Justice Britain, MD.   - PCP is Judd Lien, MD.   PMH includes:  HTN, CKD, pre-diabetes. Never smoker. BMI 33. S/p cataract extraction 11/26/14 & 10/29/14.   Preoperative labs reviewed.    EKG will be obtained DOS.   Nuclear stress test 12/06/15 [facility]): 1. No reversible ischemia or infarction. 2. Normal LV wall motion. 3. LVEF 56%. 4. Non-invasive risk stratification: Low  If no changes, I anticipate pt can proceed with surgery as scheduled.   Willeen Cass, FNP-BC [facility] Short Stay Surgical Center/Anesthesiology Phone: [phone] 01/07/2016 1:54 PM

## 2016-01-12 MED ORDER — TRANEXAMIC ACID 1000 MG/10ML IV SOLN
1000.0000 mg | INTRAVENOUS | Status: AC
  Administered 2016-01-13: 1000 mg via INTRAVENOUS
  Filled 2016-01-12: qty 10

## 2016-01-13 ENCOUNTER — Encounter (HOSPITAL_COMMUNITY)
Admission: RE | Disposition: A | Discharge status: Home / Self Care | Payer: Self-pay | Source: Ambulatory Visit | Attending: Orthopedic Surgery

## 2016-01-13 ENCOUNTER — Encounter (HOSPITAL_COMMUNITY): Payer: Self-pay | Admitting: *Deleted

## 2016-01-13 ENCOUNTER — Inpatient Hospital Stay (HOSPITAL_COMMUNITY)
Admission: RE | Admit: 2016-01-13 | Discharge: 2016-01-14 | DRG: 483 | Disposition: A | Discharge status: Home / Self Care | Payer: Medicare Other | Source: Ambulatory Visit | Attending: Orthopedic Surgery | Admitting: Orthopedic Surgery

## 2016-01-13 ENCOUNTER — Inpatient Hospital Stay (HOSPITAL_COMMUNITY): Payer: Medicare Other | Admitting: Emergency Medicine

## 2016-01-13 ENCOUNTER — Inpatient Hospital Stay (HOSPITAL_COMMUNITY): Payer: Medicare Other | Admitting: Vascular Surgery

## 2016-01-13 DIAGNOSIS — R7303 Prediabetes: Secondary | ICD-10-CM | POA: Diagnosis not present

## 2016-01-13 DIAGNOSIS — Z96642 Presence of left artificial hip joint: Secondary | ICD-10-CM | POA: Diagnosis present

## 2016-01-13 DIAGNOSIS — F17220 Nicotine dependence, chewing tobacco, uncomplicated: Secondary | ICD-10-CM | POA: Diagnosis present

## 2016-01-13 DIAGNOSIS — M25511 Pain in right shoulder: Secondary | ICD-10-CM | POA: Diagnosis not present

## 2016-01-13 DIAGNOSIS — Z87442 Personal history of urinary calculi: Secondary | ICD-10-CM | POA: Diagnosis not present

## 2016-01-13 DIAGNOSIS — I10 Essential (primary) hypertension: Secondary | ICD-10-CM | POA: Diagnosis not present

## 2016-01-13 DIAGNOSIS — Z79899 Other long term (current) drug therapy: Secondary | ICD-10-CM

## 2016-01-13 DIAGNOSIS — M19011 Primary osteoarthritis, right shoulder: Principal | ICD-10-CM | POA: Diagnosis present

## 2016-01-13 DIAGNOSIS — Z96611 Presence of right artificial shoulder joint: Secondary | ICD-10-CM

## 2016-01-13 DIAGNOSIS — G8918 Other acute postprocedural pain: Secondary | ICD-10-CM | POA: Diagnosis not present

## 2016-01-13 HISTORY — PX: TOTAL SHOULDER ARTHROPLASTY: SHX126

## 2016-01-13 LAB — GLUCOSE, CAPILLARY: GLUCOSE-CAPILLARY: 121 mg/dL — AB (ref 65–99)

## 2016-01-13 SURGERY — ARTHROPLASTY, SHOULDER, TOTAL
Anesthesia: Regional | Site: Shoulder | Laterality: Right

## 2016-01-13 MED ORDER — MIDAZOLAM HCL 2 MG/2ML IJ SOLN
INTRAMUSCULAR | Status: AC
  Filled 2016-01-13: qty 2

## 2016-01-13 MED ORDER — MENTHOL 3 MG MT LOZG
LOZENGE | OROMUCOSAL | Status: AC
  Administered 2016-01-13: 14:00:00
  Filled 2016-01-13: qty 9

## 2016-01-13 MED ORDER — ONDANSETRON HCL 4 MG PO TABS: 4.0000 mg | ORAL_TABLET | Freq: Four times a day (QID) | ORAL | Status: DC | PRN

## 2016-01-13 MED ORDER — KETOROLAC TROMETHAMINE 15 MG/ML IJ SOLN
INTRAMUSCULAR | Status: AC
  Administered 2016-01-13: 7.5 mg via INTRAVENOUS
  Filled 2016-01-13: qty 1

## 2016-01-13 MED ORDER — DOCUSATE SODIUM 100 MG PO CAPS
100.0000 mg | ORAL_CAPSULE | Freq: Two times a day (BID) | ORAL | Status: DC
  Administered 2016-01-13 – 2016-01-14 (×3): 100 mg via ORAL
  Filled 2016-01-13 (×3): qty 1

## 2016-01-13 MED ORDER — KETOROLAC TROMETHAMINE 15 MG/ML IJ SOLN
7.5000 mg | Freq: Four times a day (QID) | INTRAMUSCULAR | Status: AC
  Administered 2016-01-13 – 2016-01-14 (×3): 7.5 mg via INTRAVENOUS
  Filled 2016-01-13 (×2): qty 1

## 2016-01-13 MED ORDER — LISINOPRIL 5 MG PO TABS
5.0000 mg | ORAL_TABLET | Freq: Every day | ORAL | Status: DC
  Administered 2016-01-13 – 2016-01-14 (×2): 5 mg via ORAL
  Filled 2016-01-13 (×2): qty 1

## 2016-01-13 MED ORDER — 0.9 % SODIUM CHLORIDE (POUR BTL) OPTIME
TOPICAL | Status: DC | PRN
  Administered 2016-01-13: 1000 mL

## 2016-01-13 MED ORDER — DEXAMETHASONE SODIUM PHOSPHATE 10 MG/ML IJ SOLN
INTRAMUSCULAR | Status: AC
  Filled 2016-01-13: qty 1

## 2016-01-13 MED ORDER — HYDROMORPHONE HCL 1 MG/ML IJ SOLN
1.0000 mg | INTRAMUSCULAR | Status: DC | PRN
  Administered 2016-01-14: 1 mg via INTRAVENOUS
  Filled 2016-01-13: qty 1

## 2016-01-13 MED ORDER — MIDAZOLAM HCL 2 MG/2ML IJ SOLN
2.0000 mg | Freq: Once | INTRAMUSCULAR | Status: AC
  Administered 2016-01-13: 2 mg via INTRAVENOUS

## 2016-01-13 MED ORDER — DIAZEPAM 5 MG PO TABS
2.5000 mg | ORAL_TABLET | Freq: Four times a day (QID) | ORAL | Status: DC | PRN
  Administered 2016-01-14: 5 mg via ORAL
  Filled 2016-01-13: qty 1

## 2016-01-13 MED ORDER — PHENYLEPHRINE 40 MCG/ML (10ML) SYRINGE FOR IV PUSH (FOR BLOOD PRESSURE SUPPORT)
PREFILLED_SYRINGE | INTRAVENOUS | Status: AC
  Filled 2016-01-13: qty 10

## 2016-01-13 MED ORDER — METOCLOPRAMIDE HCL 5 MG PO TABS: 5.0000 mg | ORAL_TABLET | Freq: Three times a day (TID) | ORAL | Status: DC | PRN

## 2016-01-13 MED ORDER — PHENYLEPHRINE HCL 10 MG/ML IJ SOLN
INTRAVENOUS | Status: DC | PRN
  Administered 2016-01-13: 30 ug/min via INTRAVENOUS

## 2016-01-13 MED ORDER — LACTATED RINGERS IV SOLN
INTRAVENOUS | Status: DC
  Administered 2016-01-13 (×2): via INTRAVENOUS

## 2016-01-13 MED ORDER — FENTANYL CITRATE (PF) 100 MCG/2ML IJ SOLN
25.0000 ug | INTRAMUSCULAR | Status: DC | PRN
  Administered 2016-01-13 (×2): 50 ug via INTRAVENOUS

## 2016-01-13 MED ORDER — PHENOL 1.4 % MT LIQD
1.0000 | OROMUCOSAL | Status: DC | PRN
Start: 2016-01-13 — End: 2016-01-14

## 2016-01-13 MED ORDER — ACETAMINOPHEN 325 MG PO TABS: 650.0000 mg | ORAL_TABLET | Freq: Four times a day (QID) | ORAL | Status: DC | PRN

## 2016-01-13 MED ORDER — CEFAZOLIN SODIUM-DEXTROSE 2-4 GM/100ML-% IV SOLN
2.0000 g | INTRAVENOUS | Status: AC
  Administered 2016-01-13: 2 g via INTRAVENOUS
  Filled 2016-01-13: qty 100

## 2016-01-13 MED ORDER — CHLORHEXIDINE GLUCONATE 4 % EX LIQD: 60.0000 mL | Freq: Once | CUTANEOUS | Status: DC

## 2016-01-13 MED ORDER — METOCLOPRAMIDE HCL 5 MG/ML IJ SOLN: 5.0000 mg | Freq: Three times a day (TID) | INTRAMUSCULAR | Status: DC | PRN

## 2016-01-13 MED ORDER — BUPIVACAINE-EPINEPHRINE (PF) 0.5% -1:200000 IJ SOLN
INTRAMUSCULAR | Status: DC | PRN
  Administered 2016-01-13: 30 mL via PERINEURAL

## 2016-01-13 MED ORDER — DEXAMETHASONE SODIUM PHOSPHATE 10 MG/ML IJ SOLN
INTRAMUSCULAR | Status: DC | PRN
  Administered 2016-01-13: 5 mg via INTRAVENOUS

## 2016-01-13 MED ORDER — POLYETHYLENE GLYCOL 3350 17 G PO PACK: 17.0000 g | PACK | Freq: Every day | ORAL | Status: DC | PRN

## 2016-01-13 MED ORDER — LIDOCAINE 2% (20 MG/ML) 5 ML SYRINGE
INTRAMUSCULAR | Status: AC
  Filled 2016-01-13: qty 5

## 2016-01-13 MED ORDER — ROCURONIUM BROMIDE 100 MG/10ML IV SOLN
INTRAVENOUS | Status: DC | PRN
  Administered 2016-01-13: 60 mg via INTRAVENOUS

## 2016-01-13 MED ORDER — FENTANYL CITRATE (PF) 100 MCG/2ML IJ SOLN
INTRAMUSCULAR | Status: AC
  Administered 2016-01-13: 50 ug via INTRAVENOUS
  Filled 2016-01-13: qty 2

## 2016-01-13 MED ORDER — ALUM & MAG HYDROXIDE-SIMETH 200-200-20 MG/5ML PO SUSP: 30.0000 mL | ORAL | Status: DC | PRN

## 2016-01-13 MED ORDER — LACTATED RINGERS IV SOLN
INTRAVENOUS | Status: DC
  Administered 2016-01-13 – 2016-01-14 (×2): via INTRAVENOUS

## 2016-01-13 MED ORDER — SUGAMMADEX SODIUM 200 MG/2ML IV SOLN
INTRAVENOUS | Status: DC | PRN
  Administered 2016-01-13: 200 mg via INTRAVENOUS

## 2016-01-13 MED ORDER — LIDOCAINE HCL (CARDIAC) 20 MG/ML IV SOLN
INTRAVENOUS | Status: DC | PRN
  Administered 2016-01-13: 20 mg via INTRAVENOUS

## 2016-01-13 MED ORDER — FLEET ENEMA 7-19 GM/118ML RE ENEM: 1.0000 | ENEMA | Freq: Once | RECTAL | Status: DC | PRN

## 2016-01-13 MED ORDER — FENTANYL CITRATE (PF) 100 MCG/2ML IJ SOLN
100.0000 ug | Freq: Once | INTRAMUSCULAR | Status: AC
  Administered 2016-01-13: 50 ug via INTRAVENOUS

## 2016-01-13 MED ORDER — ACETAMINOPHEN 650 MG RE SUPP: 650.0000 mg | Freq: Four times a day (QID) | RECTAL | Status: DC | PRN

## 2016-01-13 MED ORDER — PHENYLEPHRINE HCL 10 MG/ML IJ SOLN
INTRAMUSCULAR | Status: DC | PRN
  Administered 2016-01-13: 160 ug via INTRAVENOUS

## 2016-01-13 MED ORDER — ONDANSETRON HCL 4 MG/2ML IJ SOLN: 4.0000 mg | Freq: Four times a day (QID) | INTRAMUSCULAR | Status: DC | PRN

## 2016-01-13 MED ORDER — ONDANSETRON HCL 4 MG/2ML IJ SOLN
INTRAMUSCULAR | Status: AC
  Filled 2016-01-13: qty 2

## 2016-01-13 MED ORDER — FENTANYL CITRATE (PF) 100 MCG/2ML IJ SOLN
INTRAMUSCULAR | Status: AC
  Filled 2016-01-13: qty 2

## 2016-01-13 MED ORDER — MENTHOL 3 MG MT LOZG
1.0000 | LOZENGE | OROMUCOSAL | Status: DC | PRN
  Filled 2016-01-13: qty 9

## 2016-01-13 MED ORDER — PROPOFOL 10 MG/ML IV BOLUS
INTRAVENOUS | Status: DC | PRN
  Administered 2016-01-13: 200 mg via INTRAVENOUS

## 2016-01-13 MED ORDER — BISACODYL 5 MG PO TBEC: 5.0000 mg | DELAYED_RELEASE_TABLET | Freq: Every day | ORAL | Status: DC | PRN

## 2016-01-13 MED ORDER — DIPHENHYDRAMINE HCL 12.5 MG/5ML PO ELIX: 12.5000 mg | ORAL_SOLUTION | ORAL | Status: DC | PRN

## 2016-01-13 MED ORDER — ROCURONIUM BROMIDE 10 MG/ML (PF) SYRINGE
PREFILLED_SYRINGE | INTRAVENOUS | Status: AC
  Filled 2016-01-13: qty 10

## 2016-01-13 MED ORDER — OXYCODONE HCL 5 MG PO TABS
5.0000 mg | ORAL_TABLET | ORAL | Status: DC | PRN
  Administered 2016-01-14 (×2): 10 mg via ORAL
  Filled 2016-01-13 (×2): qty 2

## 2016-01-13 MED ORDER — CEFAZOLIN SODIUM-DEXTROSE 2-4 GM/100ML-% IV SOLN
2.0000 g | Freq: Four times a day (QID) | INTRAVENOUS | Status: AC
  Administered 2016-01-13 – 2016-01-14 (×3): 2 g via INTRAVENOUS
  Filled 2016-01-13 (×3): qty 100

## 2016-01-13 MED ORDER — ONDANSETRON HCL 4 MG/2ML IJ SOLN
INTRAMUSCULAR | Status: DC | PRN
  Administered 2016-01-13: 4 mg via INTRAVENOUS

## 2016-01-13 SURGICAL SUPPLY — 60 items
BLADE SAW SGTL 83.5X18.5 (BLADE) ×3 IMPLANT
CEMENT BONE DEPUY (Cement) ×3 IMPLANT
COVER SURGICAL LIGHT HANDLE (MISCELLANEOUS) ×3 IMPLANT
DERMABOND ADHESIVE PROPEN (GAUZE/BANDAGES/DRESSINGS) ×2
DERMABOND ADVANCED .7 DNX6 (GAUZE/BANDAGES/DRESSINGS) ×1 IMPLANT
DRAPE ORTHO SPLIT 77X108 STRL (DRAPES) ×4
DRAPE SURG 17X11 SM STRL (DRAPES) ×3 IMPLANT
DRAPE SURG ORHT 6 SPLT 77X108 (DRAPES) ×2 IMPLANT
DRAPE U-SHAPE 47X51 STRL (DRAPES) ×3 IMPLANT
DRSG AQUACEL AG ADV 3.5X10 (GAUZE/BANDAGES/DRESSINGS) ×3 IMPLANT
DURAPREP 26ML APPLICATOR (WOUND CARE) ×3 IMPLANT
ELECT BLADE 4.0 EZ CLEAN MEGAD (MISCELLANEOUS) ×3
ELECT CAUTERY BLADE 6.4 (BLADE) ×3 IMPLANT
ELECT REM PT RETURN 9FT ADLT (ELECTROSURGICAL) ×3
ELECTRODE BLDE 4.0 EZ CLN MEGD (MISCELLANEOUS) ×1 IMPLANT
ELECTRODE REM PT RTRN 9FT ADLT (ELECTROSURGICAL) ×1 IMPLANT
FACESHIELD WRAPAROUND (MASK) ×9 IMPLANT
GLENOID UNI VAULTLOCK LRG (Shoulder) ×3 IMPLANT
GLOVE BIO SURGEON STRL SZ7.5 (GLOVE) ×3 IMPLANT
GLOVE BIO SURGEON STRL SZ8 (GLOVE) ×3 IMPLANT
GLOVE EUDERMIC 7 POWDERFREE (GLOVE) ×3 IMPLANT
GLOVE SS BIOGEL STRL SZ 7.5 (GLOVE) ×1 IMPLANT
GLOVE SUPERSENSE BIOGEL SZ 7.5 (GLOVE) ×2
GOWN STRL REUS W/ TWL LRG LVL3 (GOWN DISPOSABLE) ×1 IMPLANT
GOWN STRL REUS W/ TWL XL LVL3 (GOWN DISPOSABLE) ×2 IMPLANT
GOWN STRL REUS W/TWL LRG LVL3 (GOWN DISPOSABLE) ×2
GOWN STRL REUS W/TWL XL LVL3 (GOWN DISPOSABLE) ×4
HEAD HUMERAL UNIVERS II 50/19 (Head) ×3 IMPLANT
KIT BASIN OR (CUSTOM PROCEDURE TRAY) ×3 IMPLANT
KIT ROOM TURNOVER OR (KITS) ×3 IMPLANT
KIT SET UNIVERSAL (KITS) ×3 IMPLANT
MANIFOLD NEPTUNE II (INSTRUMENTS) ×3 IMPLANT
NDL SUT .5 MAYO 1.404X.05X (NEEDLE) ×1 IMPLANT
NDL SUT 6 .5 CRC .975X.05 MAYO (NEEDLE) ×1 IMPLANT
NEEDLE MAYO TAPER (NEEDLE) ×4
NS IRRIG 1000ML POUR BTL (IV SOLUTION) ×3 IMPLANT
PACK SHOULDER (CUSTOM PROCEDURE TRAY) ×3 IMPLANT
PAD ARMBOARD 7.5X6 YLW CONV (MISCELLANEOUS) ×6 IMPLANT
RESTRAINT HEAD UNIVERSAL NS (MISCELLANEOUS) ×3 IMPLANT
SET PIN UNIVERSAL REVERSE (SET/KITS/TRAYS/PACK) ×3 IMPLANT
SLING ARM FOAM STRAP LRG (SOFTGOODS) IMPLANT
SLING ARM IMMOBILIZER LRG (SOFTGOODS) ×3 IMPLANT
SLING ARM XL FOAM STRAP (SOFTGOODS) ×3 IMPLANT
SMARTMIX MINI TOWER (MISCELLANEOUS) ×3
SPONGE LAP 18X18 X RAY DECT (DISPOSABLE) ×3 IMPLANT
SPONGE LAP 4X18 X RAY DECT (DISPOSABLE) ×3 IMPLANT
STEM HUMERAL UNI APEX 10MM (Shoulder) ×3 IMPLANT
SUCTION FRAZIER HANDLE 10FR (MISCELLANEOUS) ×2
SUCTION TUBE FRAZIER 10FR DISP (MISCELLANEOUS) ×1 IMPLANT
SUT FIBERWIRE #2 38 T-5 BLUE (SUTURE) ×12
SUT MNCRL AB 3-0 PS2 18 (SUTURE) ×3 IMPLANT
SUT MON AB 2-0 CT1 36 (SUTURE) ×3 IMPLANT
SUT VIC AB 1 CT1 27 (SUTURE) ×2
SUT VIC AB 1 CT1 27XBRD ANBCTR (SUTURE) ×1 IMPLANT
SUTURE FIBERWR #2 38 T-5 BLUE (SUTURE) ×4 IMPLANT
SYR CONTROL 10ML LL (SYRINGE) IMPLANT
TOWEL OR 17X24 6PK STRL BLUE (TOWEL DISPOSABLE) ×3 IMPLANT
TOWEL OR 17X26 10 PK STRL BLUE (TOWEL DISPOSABLE) ×3 IMPLANT
TOWER SMARTMIX MINI (MISCELLANEOUS) ×1 IMPLANT
WATER STERILE IRR 1000ML POUR (IV SOLUTION) ×3 IMPLANT

## 2016-01-13 NOTE — Transfer of Care (Signed)
Immediate Anesthesia Transfer of Care Note  Patient: Douglas Rasmussen  Procedure(s) Performed: Procedure(s): RIGHT TOTAL SHOULDER ARTHROPLASTY (Right)  Patient Location: PACU  Anesthesia Type:GA combined with regional for post-op pain  Level of Consciousness: awake, alert , oriented and patient cooperative  Airway & Oxygen Therapy: Patient Spontanous Breathing and Patient connected to nasal cannula oxygen  Post-op Assessment: Report given to RN and Post -op Vital signs reviewed and stable  Post vital signs: Reviewed and stable  Last Vitals:  Vitals:   01/13/16 0921  BP: 139/81  Pulse: 81  Resp: 20  Temp: 36.7 C    Last Pain: There were no vitals filed for this visit.    Patients Stated Pain Goal: 2 ([id number])  Complications: No apparent anesthesia complications

## 2016-01-13 NOTE — H&P (Signed)
Douglas Rasmussen    Chief Complaint: right shoulder osteoarthritis HPI: The patient is a 70 y.o. male with end stage right shoulder OA  Past Medical History:  Diagnosis Date  . Arthritis   . Chronic kidney disease    kidney stones  . H/O cardiovascular stress test 12/2015  . History of kidney stones    last episode 2-3 yrs. ago, but also remarks that he has had cystocopy & lithotripsy   . Hypertension   . Pre-diabetes     Past Surgical History:  Procedure Laterality Date  . CATARACT EXTRACTION W/PHACO Right 10/29/2014   Procedure: CATARACT EXTRACTION PHACO AND INTRAOCULAR LENS PLACEMENT RIGHT EYE CDE=6.86;  Surgeon: Tonny Branch, MD;  Location: [facility];  Service: Ophthalmology;  Laterality: Right;  . CATARACT EXTRACTION W/PHACO Left 11/26/2014   Procedure: CATARACT EXTRACTION PHACO AND INTRAOCULAR LENS PLACEMENT (IOC);  Surgeon: Tonny Branch, MD;  Location: [facility];  Service: Ophthalmology;  Laterality: Left;  CDE:5.33  . COLONOSCOPY    . JOINT REPLACEMENT Left 2012   hip  . KIDNEY STONE SURGERY    . TONSILLECTOMY      History reviewed. No pertinent family history.  Social History:  reports that he has never smoked. His smokeless tobacco use includes Snuff. He reports that he drinks about 30.0 - 36.0 oz of alcohol per week . He reports that he does not use drugs.   Medications Prior to Admission  Medication Sig Dispense Refill  . Aspirin-Salicylamide-Caffeine (BC HEADACHE POWDER PO) Take 1 packet by mouth daily.    Marland Kitchen CINNAMON PO Take 1 capsule by mouth daily.    . Coenzyme Q10 (CO Q 10 PO) Take 1 tablet by mouth daily.    Marland Kitchen lisinopril (PRINIVIL,ZESTRIL) 5 MG tablet Take 5 mg by mouth daily.    . Multiple Vitamins-Minerals (CENTRUM SILVER PO) Take 1 tablet by mouth daily.    . Omega-3 Fatty Acids (FISH OIL) 1000 MG CAPS Take 1 capsule by mouth daily.       Physical Exam: right shoulder with painful and restricted motion as noted at recent office visits  Vitals  Temp:  [98.1 F  (36.7 C)] 98.1 F (36.7 C) (11/09 0921) Pulse Rate:  [81] 81 (11/09 0921) Resp:  [20] 20 (11/09 0921) BP: (139)/(81) 139/81 (11/09 0921) SpO2:  [99 %] 99 % (11/09 0921) Weight:  [97.4 kg (214 lb 11.2 oz)] 97.4 kg (214 lb 11.2 oz) (11/09 0921)  Assessment/Plan  Impression: right shoulder osteoarthritis  Plan of Action: Procedure(s): RIGHT TOTAL SHOULDER ARTHROPLASTY  Douglas Rasmussen 01/13/2016, 9:37 AM Contact # [phone]

## 2016-01-13 NOTE — Anesthesia Procedure Notes (Signed)
Procedure Name: Intubation Date/Time: 01/13/2016 10:39 AM Performed by: Salli Quarry Coleta Grosshans Pre-anesthesia Checklist: Patient identified, Emergency Drugs available, Suction available and Patient being monitored Patient Re-evaluated:Patient Re-evaluated prior to inductionOxygen Delivery Method: Circle System Utilized Preoxygenation: Pre-oxygenation with 100% oxygen Intubation Type: IV induction Ventilation: Mask ventilation without difficulty and Oral airway inserted - appropriate to patient size Laryngoscope Size: Mac and 4 Grade View: Grade III Tube type: Oral Tube size: 7.5 mm Number of attempts: 1 Airway Equipment and Method: Stylet and Oral airway Placement Confirmation: ETT inserted through vocal cords under direct vision,  positive ETCO2 and breath sounds checked- equal and bilateral Secured at: 23 cm Tube secured with: Tape Dental Injury: Teeth and Oropharynx as per pre-operative assessment

## 2016-01-13 NOTE — Anesthesia Procedure Notes (Addendum)
Anesthesia Regional Block:  Interscalene brachial plexus block  Pre-Anesthetic Checklist: ,, timeout performed, Correct Patient, Correct Site, Correct Laterality, Correct Procedure, Correct Position, site marked, Risks and benefits discussed, pre-op evaluation,  At surgeon's request and post-op pain management  Laterality: Right  Prep: Maximum Sterile Barrier Precautions used, chloraprep       Needles:  Injection technique: Single-shot  Needle Type: Echogenic Stimulator Needle     Needle Length: 5cm 5 cm Needle Gauge: 22 and 22 G    Additional Needles:  Procedures: ultrasound guided (picture in chart) and nerve stimulator Interscalene brachial plexus block  Nerve Stimulator or Paresthesia:  Response: Biceps response,   Additional Responses:   Narrative:  Start time: 01/13/2016 8:38 AM End time: 01/13/2016 8:48 AM Injection made incrementally with aspirations every 5 mL. Anesthesiologist: Roderic Palau  Additional Notes: 2% Lidocaine skin wheel.

## 2016-01-13 NOTE — Op Note (Signed)
01/13/2016  12:27 PM  PATIENT:   Douglas Rasmussen  70 y.o. male  PRE-OPERATIVE DIAGNOSIS:  right shoulder osteoarthritis  POST-OPERATIVE DIAGNOSIS:  same  PROCEDURE:  R TSA #10 stem, 50x19, large glenoid  SURGEON:  Lachrisha Ziebarth, Metta Clines M.D.  ASSISTANTS: Shuford pac   ANESTHESIA:   GET + ISB  EBL: 200  SPECIMEN:  none  Drains: none   PATIENT DISPOSITION:  PACU - hemodynamically stable.    PLAN OF CARE: Admit for overnight observation  Dictation# ???   Contact # [phone]

## 2016-01-13 NOTE — Anesthesia Preprocedure Evaluation (Addendum)
Anesthesia Evaluation  Patient identified by MRN, date of birth, ID band Patient awake    Reviewed: Allergy & Precautions, H&P , NPO status , Patient's Chart, lab work & pertinent test results  Airway Mallampati: III  TM Distance: >3 FB Neck ROM: Full    Dental no notable dental hx. (+) Teeth Intact, Dental Advisory Given   Pulmonary neg pulmonary ROS,    Pulmonary exam normal breath sounds clear to auscultation       Cardiovascular hypertension, Pt. on medications  Rhythm:Regular Rate:Normal     Neuro/Psych negative neurological ROS  negative psych ROS   GI/Hepatic negative GI ROS, Neg liver ROS,   Endo/Other  negative endocrine ROS  Renal/GU Renal disease  negative genitourinary   Musculoskeletal  (+) Arthritis , Osteoarthritis,    Abdominal   Peds  Hematology negative hematology ROS (+)   Anesthesia Other Findings   Reproductive/Obstetrics negative OB ROS                            Anesthesia Physical Anesthesia Plan  ASA: II  Anesthesia Plan: General and Regional   Post-op Pain Management: GA combined w/ Regional for post-op pain   Induction: Intravenous  Airway Management Planned: Oral ETT  Additional Equipment:   Intra-op Plan:   Post-operative Plan: Extubation in OR  Informed Consent: I have reviewed the patients History and Physical, chart, labs and discussed the procedure including the risks, benefits and alternatives for the proposed anesthesia with the patient or authorized representative who has indicated his/her understanding and acceptance.   Dental advisory given  Plan Discussed with: CRNA  Anesthesia Plan Comments:        Anesthesia Quick Evaluation

## 2016-01-13 NOTE — Anesthesia Postprocedure Evaluation (Signed)
Anesthesia Post Note  Patient: Douglas Rasmussen  Procedure(s) Performed: Procedure(s) (LRB): RIGHT TOTAL SHOULDER ARTHROPLASTY (Right)  Patient location during evaluation: PACU Anesthesia Type: General and Regional Level of consciousness: awake and alert Pain management: pain level controlled Vital Signs Assessment: post-procedure vital signs reviewed and stable Respiratory status: spontaneous breathing, nonlabored ventilation and respiratory function stable Cardiovascular status: blood pressure returned to baseline and stable Postop Assessment: no signs of nausea or vomiting Anesthetic complications: no    Last Vitals:  Vitals:   01/13/16 1400 01/13/16 1415  BP: 114/72 112/79  Pulse: 79 81  Resp: (!) 21 15  Temp:      Last Pain:  Vitals:   01/13/16 1400  PainSc: 0-No pain                 Amaryah Mallen,W. EDMOND

## 2016-01-13 NOTE — Discharge Instructions (Signed)

## 2016-01-14 MED ORDER — HYDROMORPHONE HCL 2 MG PO TABS: 2.0000 mg | ORAL_TABLET | ORAL | 0 refills | Status: DC | PRN

## 2016-01-14 MED ORDER — OXYCODONE-ACETAMINOPHEN 5-325 MG PO TABS: 1.0000 | ORAL_TABLET | ORAL | 0 refills | Status: DC | PRN

## 2016-01-14 MED ORDER — DIAZEPAM 5 MG PO TABS: 2.5000 mg | ORAL_TABLET | Freq: Four times a day (QID) | ORAL | 1 refills | Status: DC | PRN

## 2016-01-14 MED ORDER — ONDANSETRON HCL 4 MG PO TABS: 4.0000 mg | ORAL_TABLET | Freq: Three times a day (TID) | ORAL | 0 refills | Status: DC | PRN

## 2016-01-14 NOTE — Op Note (Signed)
NAMEMarland Rasmussen  ELISA, JEANNOT NO.:  [id number]  MEDICAL RECORD NO.:  [id number]  LOCATION:  6N03C                        FACILITY:  [facility]  PHYSICIAN:  Metta Clines. Shanekqua Schaper, M.D.  DATE OF BIRTH:  [birth year]  DATE OF PROCEDURE:  01/13/2016 DATE OF DISCHARGE:                              OPERATIVE REPORT   PREOPERATIVE DIAGNOSIS:  End-stage right shoulder osteoarthritis.  POSTOPERATIVE DIAGNOSIS:  End-stage right shoulder osteoarthritis.  PROCEDURE:  Right total shoulder arthroplasty utilizing a press-fit size 10 Arthrex stem with a 50 x 19 concentric head and a large glenoid.  SURGEON:  Metta Clines. Nekayla Heider, M.D.  Terrence DupontOlivia Mackie A. Shuford, P.A.-C.  ANESTHESIA:  General endotracheal as well as interscalene block.  ESTIMATED BLOOD LOSS:  200 mL.  DRAINS:  None.  HISTORY:  Mr. [name] is a 70 year old gentleman, who has had chronic and progressive increasing right shoulder pain with restrictions in mobility and increasing functional limitations related to end-stage osteoarthritis.  Due to his ongoing pain and functional limitations, he is brought to the operating room at this time for planned right total shoulder arthroplasty.  Preoperatively, I had counseled Mr. [name] regarding treatment options and potential risks versus benefits thereof.  Possible surgical complications were all reviewed including bleeding, infection, neurovascular injury, persistent pain, loss of motion, anesthetic complication, failure of the implant, and possible need for additional surgery.  He understands and accepts and agrees with our planned procedure.  PROCEDURE IN DETAIL:  After undergoing routine preop evaluation, the patient received prophylactic antibiotics and an interscalene block was established in the holding area by the Anesthesia Department.  Placed supine on the operating table.  Underwent smooth induction of general endotracheal anesthesia.  Placed in beach chair position  and appropriately padded and protected.  The right shoulder girdle region was then sterilely prepped and draped in standard fashion.  Time-out was called.  An anterior deltopectoral approach of the right shoulder was made through an 8-cm incision.  Skin flaps were elevated and electrocautery was used for hemostasis.  Dissection carried deeply with the deltoid and cephalic vein taken laterally.  The upper centimeter and a half of the pec major was tenotomized to enhance exposure and we then divided adhesions beneath the deltoid, mobilized conjoined tendon and retracted this medially.  At this point, then we unroofed the biceps tendon.  This was then tenotomized for later tenodesis and then split the rotator cuff along the rotator interval to the base of the coracoid and then we tenotomized the subscapularis and then released the capsular tissues from anteroinferior and inferior aspects of the humeral head and neck.  We then tagged the free margin of the subscapularis tendon with a series of 3 #2 FiberWire grasping sutures.  At this point, the humeral head was delivered through the wound.  We outlined the proposed humeral head resection using the extramedullary guide and performed the head resection using an oscillating saw.  We then removed large osteophytes on the anteroinferior and posteroinferior aspects of the humeral neck. We then hand reamed the humeral canal with a size 7 and broached up to size 10 maintaining the native retroversion approximately 30 degrees. Once we had  achieved proper broaching, we then placed a trial with metal cap into the proximal humerus protecting the cut surface of the proximal humerus.  At this point, we exposed the glenoid with combination of Fukuda, pitchfork, and snake tongue retractors.  We performed a circumferential labral resection gaining complete visualization of the margins of the glenoid.  We then placed the guidepin into the center of the  glenoid and sized this at a size large.  We used large reamer to obtain a stable subchondral bony bed.  Using a rongeur, we removed some residual bone at the margins of the glenoid.  We then placed our central drill hole followed by the superior and inferior PEG and slot respectively.  The glenoid was then broached and a trial glenoid showed excellent fit and fixation.  At this point, the glenoid was irrigated, cleaned, and dried.  We mixed cement and introduced this into the superior and inferior PEG and slot respectively.  We then impacted the size large glenoid with excellent fit and fixation.  At this point, we then returned our attention to the proximal humerus where the canal was cleaned and irrigated.  We then impacted our size 10 stem with excellent interference fit and fixation and then tightened the proximal locking screws.  We then performed a series of trial reductions, ultimately found the concentrically aligned 50 x 19 head showed the best fit and soft tissue balance.  The final 50 x 19 concentric head was then impacted onto the cleaned and dried Morse taper.  We then performed a final reduction, which showed excellent soft tissue balance and approximately 50% posterior translation of the humeral head and glenoid. The joint was then copiously irrigated.  We then repaired the subscapularis back to the lesser tuberosity through the soft tissue cuff using the #2 FiberWires and also repaired the rotator interval with pair of figure-of-eight #2 FiberWire sutures.  The biceps tendon was then tenodesis to the upper border of the pec major using #2 FiberWire and excised the residual proximal stump of the biceps.  At this point, the wound was then copiously irrigated.  Hemostasis was obtained.  We confirmed the arm could easily be externally rotated to 35 degrees without excessive tension on the subscapularis repair.  The deltopectoral interval was then reapproximated with a series of  figure- of-eight #1 FiberWire sutures.  A 2-0 Monocryl used for the subcu layer, intracuticular 3-0 Monocryl for the skin followed by Dermabond and Aquacel dressing.  Right arm was placed in a sling.  The patient was awakened, extubated, and taken to the recovery room in stable condition.  Tracy A. Shuford, P.A.-C., was used as an Environmental consultant throughout this case, was essential for help with positioning the patient, positioning the extremity, management of the tissue retractors, tissue manipulation, implantation of prosthesis, wound closure, and intraoperative decision making.     Metta Clines. Ecko Beasley, M.D.     KMS/MEDQ  D:  01/13/2016  T:  01/14/2016  Job:  [id number]

## 2016-01-14 NOTE — Progress Notes (Signed)
D/C papers were gone over with pt. By SWAT RN.

## 2016-01-14 NOTE — Consult Note (Signed)
            [facility] CM Primary Care Navigator  01/14/2016  EINAR KRIEG 1945-08-22 [id number]   Wentto see patient in room to identify possible discharge needs butpatient was already discharged.  Primary care provider's office called (Courtney)and notified of patient's discharge and need for possible post hospital follow-up and transition of care.   For additional questions please contact:  Edwena Felty A. Cynde Menard, BSN, RN-BC [facility] PRIMARY CARE Navigator Cell: [phone]

## 2016-01-14 NOTE — Evaluation (Signed)
Occupational Therapy Evaluation and Discharge Patient Details Name: JOBANI ARABIE MRN: [id number] DOB: 26-Dec-1945 Today's Date: 01/14/2016    History of Present Illness Pt is a 70 y/o male presenting post-op for Right shoulder replacement. Pt  has a past medical history of Arthritis; Chronic kidney disease; Hypertension; L hip replacement (2012); and Pre-diabetes.   Clinical Impression   PTA Pt min A with ADL, and independent in mobility. Pt currently max A for BUE ADL, with wife acting as competent caregiver, and independent in mobility. Pt and wife received all education, and performed teach back method for dressing, bathing, sling wearing schedule, positioning, and exercises. Pt and wife provided with handout and HEP with photos to refer back to. No questions or concerns from Pt or wife going home. Progress rehab of shoulder as order by MD at follow up. Thank you for this referral.     Follow Up Recommendations  Supervision - Intermittent (Progress rehab of shoulder as MD orders at follow up)    Equipment Recommendations  None recommended by OT    Recommendations for Other Services       Precautions / Restrictions Precautions Precautions: Shoulder Type of Shoulder Precautions: Passive Protocol Shoulder Interventions: Shoulder sling/immobilizer;At all times;Off for dressing/bathing/exercises (May come out of sling if sitting in controlled environment.) Precaution Booklet Issued: Yes (comment) Precaution Comments: Shoulder d/c handout reviewed thoroughly with Pt Required Braces or Orthoses: Sling (Sleep in sling at least 4 weeks post-op) Restrictions Weight Bearing Restrictions: Yes RUE Weight Bearing: Non weight bearing Other Position/Activity Restrictions: Ok for pendulums, FF 0-90, Abduction 0-60, ER 0-30, NO AROM of shoulder      Mobility Bed Mobility Overal bed mobility: Needs Assistance Bed Mobility: Rolling;Sidelying to Sit Rolling: Modified independent  (Device/Increase time) Sidelying to sit: Supervision (use of bed rail)       General bed mobility comments: heavy use of bedrail, Pt explained that he'd been practicing getting out on the Left side  Transfers Overall transfer level: Needs assistance Equipment used:  (sling) Transfers: Sit to/from Stand Sit to Stand: Supervision         General transfer comment: no attempt to use RUE    Balance Overall balance assessment: No apparent balance deficits (not formally assessed)                                          ADL Overall ADL's : Needs assistance/impaired Eating/Feeding: Minimal assistance;Sitting;With caregiver independent assisting Eating/Feeding Details (indicate cue type and reason): assist for cutting up some foods Grooming: Oral care;Wash/dry face;Standing;Brushing hair;Modified independent;Minimal assistance;With caregiver independent assisting Grooming Details (indicate cue type and reason): Pt needs assistance for BUE hand tasks, and his L shoulder has limited ROM (will probably end up being replaced per Pt) Upper Body Bathing: Minimal assitance;With caregiver independent assisting;Standing Upper Body Bathing Details (indicate cue type and reason): Pt and wife at baseline, provided with education in how to shower underarm Lower Body Bathing: Modified independent   Upper Body Dressing : Maximal assistance;With caregiver independent assisting;Adhering to UE precautions;Sitting;Standing;Cueing for sequencing Upper Body Dressing Details (indicate cue type and reason): got on button up shirt Lower Body Dressing: Moderate assistance;Sit to/from stand;With caregiver independent assisting Lower Body Dressing Details (indicate cue type and reason): donned underwear and pants Toilet Transfer: Modified Independent       Tub/ Shower Transfer: Supervision/safety;With caregiver independent assisting   Functional mobility during  ADLs: Modified  independent General ADL Comments: Pt will have wife at home to help assist with BUE tasks     Vision     Perception     Praxis      Pertinent Vitals/Pain Pain Assessment: 0-10 Pain Score: 8  Pain Location: R shoulder Pain Descriptors / Indicators: Aching;Heaviness;Sore Pain Intervention(s): Limited activity within patient's tolerance;Monitored during session;Repositioned     Hand Dominance Right   Extremity/Trunk Assessment Upper Extremity Assessment Upper Extremity Assessment: RUE deficits/detail RUE Deficits / Details: typical post-op deficits in strength and ROM RUE: Unable to fully assess due to immobilization;Unable to fully assess due to pain   Lower Extremity Assessment Lower Extremity Assessment: Overall WFL for tasks assessed   Cervical / Trunk Assessment Cervical / Trunk Assessment: Other exceptions (previous back surgeries)   Communication Communication Communication: No difficulties   Cognition Arousal/Alertness: Awake/alert Behavior During Therapy: WFL for tasks assessed/performed Overall Cognitive Status: Within Functional Limits for tasks assessed                     General Comments       Exercises Exercises: Shoulder     Shoulder Instructions Shoulder Instructions Donning/doffing shirt without moving shoulder: Maximal assistance;Caregiver independent with task;Patient able to independently direct caregiver Method for sponge bathing under operated UE: Modified independent Donning/doffing sling/immobilizer: Maximal assistance;Caregiver independent with task;Patient able to independently direct caregiver Correct positioning of sling/immobilizer: Modified independent;Caregiver independent with task;Patient able to independently direct caregiver Pendulum exercises (written home exercise program): Supervision/safety ROM for elbow, wrist and digits of operated UE: Modified independent Sling wearing schedule (on at all times/off for ADL's):  Modified independent Proper positioning of operated UE when showering: Caregiver independent with task;Patient able to independently direct caregiver;Set-up Positioning of UE while sleeping: Minimal assistance;Caregiver independent with task;Patient able to independently direct caregiver    Home Living Family/patient expects to be discharged to:: Private residence Living Arrangements: Spouse/significant other Available Help at Discharge: Family;Available PRN/intermittently Type of Home: House Home Access: Stairs to enter [employer] of Steps: 2 Entrance Stairs-Rails: None Home Layout: One level     Bathroom Shower/Tub: Tub/shower unit Shower/tub characteristics: Door Bathroom Toilet: Handicapped height Bathroom Accessibility: Yes How Accessible: Accessible via walker Home Equipment: [place] - 2 wheels;Cane - single point;Bedside commode          Prior Functioning/Environment Level of Independence: Independent                 OT Problem List: Decreased strength;Decreased range of motion;Decreased activity tolerance;Decreased knowledge of precautions;Impaired UE functional use;Pain   OT Treatment/Interventions:      OT Goals(Current goals can be found in the care plan section) Acute Rehab OT Goals Patient Stated 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 OT Goal Formulation: With patient/family Time For Goal Achievement: 01/21/16 Potential to Achieve Goals: Good  OT Frequency:     Barriers to D/C:            Co-evaluation              End of Session Equipment Utilized During Treatment: Other (comment) (sling) Nurse Communication: Mobility status  Activity Tolerance: Patient tolerated treatment well Patient left: in chair;with call bell/phone within reach;with family/visitor present   Time: [id number] OT Time Calculation (min): 66 min Charges:  OT General Charges $OT Visit: 1 Procedure OT Evaluation $OT Eval Moderate Complexity: 1 Procedure OT  Treatments $Self Care/Home Management : 23-37 mins $Therapeutic Exercise: 8-22 mins G-Codes:    Mickel Baas  J Xai Frerking 01/14/2016, 11:33 AM Hulda Humphrey OTR/L [phone]

## 2016-01-14 NOTE — Discharge Summary (Signed)
PATIENT ID:      Douglas Rasmussen  MRN:     [id number] DOB/AGE:    [birth year] / 70 y.o.     DISCHARGE SUMMARY  ADMISSION DATE:    01/13/2016 DISCHARGE DATE:    ADMISSION DIAGNOSIS: right shoulder osteoarthritis Past Medical History:  Diagnosis Date  . Arthritis    "qwhere" (01/13/2016)  . Chronic kidney disease    kidney stones  . H/O cardiovascular stress test 12/2015  . History of kidney stones    last episode 2-3 yrs. ago, but also remarks that he has had cystocopy & lithotripsy   . Hypertension   . Pre-diabetes     DISCHARGE DIAGNOSIS:   Active Problems:   S/P shoulder replacement, right   PROCEDURE: Procedure(s): RIGHT TOTAL SHOULDER ARTHROPLASTY on 01/13/2016  CONSULTS:    HISTORY:  See H&P in chart.  HOSPITAL COURSE:  Douglas Rasmussen is a 70 y.o. admitted on 01/13/2016 with a diagnosis of right shoulder osteoarthritis.  They were brought to the operating room on 01/13/2016 and underwent Procedure(s): RIGHT TOTAL SHOULDER ARTHROPLASTY.    They were given perioperative antibiotics: Anti-infectives    Start     Dose/Rate Route Frequency Ordered Stop   01/13/16 1700  ceFAZolin (ANCEF) IVPB 2g/100 mL premix     2 g 200 mL/hr over 30 Minutes Intravenous Every 6 hours 01/13/16 1321 01/14/16 0533   01/13/16 0805  ceFAZolin (ANCEF) IVPB 2g/100 mL premix     2 g 200 mL/hr over 30 Minutes Intravenous On call to O.R. 01/13/16 0805 01/13/16 1048    .  Patient underwent the above named procedure and tolerated it well. The following day they were hemodynamically stable and pain was controlled on oral analgesics. They were neurovascularly intact to the operative extremity. OT was ordered and worked with patient per protocol. They were medically and orthopaedically stable for discharge on day 1.  DIAGNOSTIC STUDIES:  RECENT RADIOGRAPHIC STUDIES :  No results found.  RECENT VITAL SIGNS:  Patient Vitals for the past 24 hrs:  BP Temp Temp src Pulse Resp SpO2 Height Weight  01/14/16 0421  (!) 112/57 98.3 F (36.8 C) Oral 90 18 92 % - -  01/13/16 2112 118/68 97.6 F (36.4 C) Oral (!) 110 18 92 % - -  01/13/16 1444 116/81 97.9 F (36.6 C) Oral 83 18 97 % 5\' 8"  (1.727 m) 98.2 kg (216 lb 7.9 oz)  01/13/16 1415 112/79 98 F (36.7 C) - 81 15 98 % - -  01/13/16 1400 114/72 - - 79 (!) 21 99 % - -  01/13/16 1345 109/72 - - 83 16 98 % - -  01/13/16 1330 118/73 - - 87 16 98 % - -  01/13/16 1315 122/72 - - 82 19 94 % - -  01/13/16 1310 - - - 82 18 98 % - -  01/13/16 1305 117/72 - - 79 19 98 % - -  01/13/16 1300 117/72 98 F (36.7 C) - 84 (!) 23 96 % - -  01/13/16 0921 139/81 98.1 F (36.7 C) - 81 20 99 % 5\' 8"  (1.727 m) 97.4 kg (214 lb 11.2 oz)  .  RECENT EKG RESULTS:    Orders placed or performed during the hospital encounter of 01/13/16  . EKG 12-Lead  . EKG 12-Lead    DISCHARGE INSTRUCTIONS:    DISCHARGE MEDICATIONS:     Medication List    TAKE these medications   BC HEADACHE POWDER PO Take 1  packet by mouth daily.   CENTRUM SILVER PO Take 1 tablet by mouth daily.   CINNAMON PO Take 1 capsule by mouth daily.   CO Q 10 PO Take 1 tablet by mouth daily.   diazepam 5 MG tablet Commonly known as:  VALIUM Take 0.5-1 tablets (2.5-5 mg total) by mouth every 6 (six) hours as needed for muscle spasms or sedation.   Fish Oil 1000 MG Caps Take 1 capsule by mouth daily.   HYDROmorphone 2 MG tablet Commonly known as:  DILAUDID Take 1-2 tablets (2-4 mg total) by mouth every 4 (four) hours as needed (for pain not controlled by percocet).   lisinopril 5 MG tablet Commonly known as:  PRINIVIL,ZESTRIL Take 5 mg by mouth daily.   ondansetron 4 MG tablet Commonly known as:  ZOFRAN Take 1 tablet (4 mg total) by mouth every 8 (eight) hours as needed for nausea or vomiting.   oxyCODONE-acetaminophen 5-325 MG tablet Commonly known as:  PERCOCET Take 1-2 tablets by mouth every 4 (four) hours as needed.       FOLLOW UP VISIT:   Follow-up Information    Metta Clines  SUPPLE, MD.   Specialty:  Orthopedic Surgery Why:  call to be seen in 10-14 days Contact information: [address] [place] [address] [id number]           DISCHARGE TO: Home  DISPOSITION: Good  DISCHARGE CONDITION:  Festus Barren for Dr. Justice Britain 01/14/2016, 8:56 AM

## 2016-01-17 ENCOUNTER — Encounter (HOSPITAL_COMMUNITY): Payer: Self-pay | Admitting: Orthopedic Surgery

## 2016-01-26 DIAGNOSIS — Z96611 Presence of right artificial shoulder joint: Secondary | ICD-10-CM | POA: Diagnosis not present

## 2016-01-26 DIAGNOSIS — Z471 Aftercare following joint replacement surgery: Secondary | ICD-10-CM | POA: Diagnosis not present

## 2016-01-31 DIAGNOSIS — Z96611 Presence of right artificial shoulder joint: Secondary | ICD-10-CM | POA: Diagnosis not present

## 2016-02-03 DIAGNOSIS — Z96611 Presence of right artificial shoulder joint: Secondary | ICD-10-CM | POA: Diagnosis not present

## 2016-02-07 DIAGNOSIS — Z96611 Presence of right artificial shoulder joint: Secondary | ICD-10-CM | POA: Diagnosis not present

## 2016-02-09 DIAGNOSIS — Z96611 Presence of right artificial shoulder joint: Secondary | ICD-10-CM | POA: Diagnosis not present

## 2016-02-11 DIAGNOSIS — Z96611 Presence of right artificial shoulder joint: Secondary | ICD-10-CM | POA: Diagnosis not present

## 2016-02-14 DIAGNOSIS — Z96611 Presence of right artificial shoulder joint: Secondary | ICD-10-CM | POA: Diagnosis not present

## 2016-02-16 DIAGNOSIS — Z96611 Presence of right artificial shoulder joint: Secondary | ICD-10-CM | POA: Diagnosis not present

## 2016-02-18 DIAGNOSIS — Z96611 Presence of right artificial shoulder joint: Secondary | ICD-10-CM | POA: Diagnosis not present

## 2016-02-21 DIAGNOSIS — Z96611 Presence of right artificial shoulder joint: Secondary | ICD-10-CM | POA: Diagnosis not present

## 2016-02-23 DIAGNOSIS — Z96611 Presence of right artificial shoulder joint: Secondary | ICD-10-CM | POA: Diagnosis not present

## 2016-02-23 DIAGNOSIS — Z471 Aftercare following joint replacement surgery: Secondary | ICD-10-CM | POA: Diagnosis not present

## 2016-02-25 DIAGNOSIS — Z96611 Presence of right artificial shoulder joint: Secondary | ICD-10-CM | POA: Diagnosis not present

## 2016-03-29 DIAGNOSIS — Z96611 Presence of right artificial shoulder joint: Secondary | ICD-10-CM | POA: Diagnosis not present

## 2016-03-29 DIAGNOSIS — Z471 Aftercare following joint replacement surgery: Secondary | ICD-10-CM | POA: Diagnosis not present

## 2016-07-07 DIAGNOSIS — Z471 Aftercare following joint replacement surgery: Secondary | ICD-10-CM | POA: Diagnosis not present

## 2016-07-07 DIAGNOSIS — G8929 Other chronic pain: Secondary | ICD-10-CM | POA: Diagnosis not present

## 2016-07-07 DIAGNOSIS — Z96611 Presence of right artificial shoulder joint: Secondary | ICD-10-CM | POA: Diagnosis not present

## 2016-07-07 DIAGNOSIS — M25512 Pain in left shoulder: Secondary | ICD-10-CM | POA: Diagnosis not present

## 2016-08-17 DIAGNOSIS — M1612 Unilateral primary osteoarthritis, left hip: Secondary | ICD-10-CM | POA: Diagnosis not present

## 2016-08-17 DIAGNOSIS — M1712 Unilateral primary osteoarthritis, left knee: Secondary | ICD-10-CM | POA: Diagnosis not present

## 2016-08-17 DIAGNOSIS — Z471 Aftercare following joint replacement surgery: Secondary | ICD-10-CM | POA: Diagnosis not present

## 2016-08-17 DIAGNOSIS — Z96642 Presence of left artificial hip joint: Secondary | ICD-10-CM | POA: Diagnosis not present

## 2016-12-04 DIAGNOSIS — H35373 Puckering of macula, bilateral: Secondary | ICD-10-CM | POA: Diagnosis not present

## 2016-12-04 DIAGNOSIS — H1045 Other chronic allergic conjunctivitis: Secondary | ICD-10-CM | POA: Diagnosis not present

## 2016-12-04 DIAGNOSIS — Z961 Presence of intraocular lens: Secondary | ICD-10-CM | POA: Diagnosis not present

## 2016-12-04 DIAGNOSIS — H02109 Unspecified ectropion of unspecified eye, unspecified eyelid: Secondary | ICD-10-CM | POA: Diagnosis not present

## 2017-01-17 DIAGNOSIS — M1612 Unilateral primary osteoarthritis, left hip: Secondary | ICD-10-CM | POA: Diagnosis not present

## 2017-01-17 DIAGNOSIS — M1611 Unilateral primary osteoarthritis, right hip: Secondary | ICD-10-CM | POA: Diagnosis not present

## 2017-01-17 DIAGNOSIS — M25551 Pain in right hip: Secondary | ICD-10-CM | POA: Diagnosis not present

## 2017-01-24 DIAGNOSIS — M25551 Pain in right hip: Secondary | ICD-10-CM | POA: Diagnosis not present

## 2017-01-24 DIAGNOSIS — M1611 Unilateral primary osteoarthritis, right hip: Secondary | ICD-10-CM | POA: Diagnosis not present

## 2017-01-29 ENCOUNTER — Other Ambulatory Visit (HOSPITAL_COMMUNITY): Payer: Self-pay | Admitting: *Deleted

## 2017-01-29 NOTE — Pre-Procedure Instructions (Signed)
JESAIAH FABIANO  01/29/2017    Your procedure is scheduled on Thursday, February 01, 2017 at 1:30 PM.   Report to [facility] Entrance "A" Admitting Office at 11:30 AM.   Call this number if you have problems the morning of surgery: [phone]   Questions prior to day of surgery, please call [phone] between 8 & 4 PM.   Remember:  Do not eat food or drink liquids after midnight Wednesday, 01/31/17.   Stop Aspirin products (BC Powder), Herbal medications (Cinnamon, co Q10), Multivitamins and Fish Oil as of today. Do not use NSAIDS (Ibuprofen, Aleve, etc) prior to surgery.   Do not wear jewelry.  Do not wear lotions, powders, perfumes or deodorant.  Men may shave face and neck.  Do not bring valuables to the hospital.  [facility] is not responsible for any belongings or valuables.  Contacts, dentures or bridgework may not be worn into surgery.  Leave your suitcase in the car.  After surgery it may be brought to your room.  For patients admitted to the hospital, discharge time will be determined by your treatment team.  [facility] - Preparing for Surgery  Before surgery, you can play an important role.  Because skin is not sterile, your skin needs to be as free of germs as possible.  You can reduce the number of germs on you skin by washing with CHG (chlorahexidine gluconate) soap before surgery.  CHG is an antiseptic cleaner which kills germs and bonds with the skin to continue killing germs even after washing.  Please DO NOT use if you have an allergy to CHG or antibacterial soaps.  If your skin becomes reddened/irritated stop using the CHG and inform your nurse when you arrive at Short Stay.  Do not shave (including legs and underarms) for at least 48 hours prior to the first CHG shower.  You may shave your face.  Please follow these instructions carefully:   1.  Shower with CHG Soap the night before surgery and the                    morning of Surgery.  2.  If  you choose to wash your hair, wash your hair first as usual with your       normal shampoo.  3.  After you shampoo, rinse your hair and body thoroughly to remove the shampoo.  4.  Use CHG as you would any other liquid soap.  You can apply chg directly       to the skin and wash gently with scrungie or a clean washcloth.  5.  Apply the CHG Soap to your body ONLY FROM THE NECK DOWN.        Do not use on open wounds or open sores.  Avoid contact with your eyes, ears, mouth and genitals (private parts).  Wash genitals (private parts) with your normal soap.  6.  Wash thoroughly, paying special attention to the area where your surgery        will be performed.  7.  Thoroughly rinse your body with warm water from the neck down.  8.  DO NOT shower/wash with your normal soap after using and rinsing off       the CHG Soap.  9.  Pat yourself dry with a clean towel.            10.  Wear clean pajamas.  11.  Place clean sheets on your bed the night of your first shower and do not        sleep with pets.  Day of Surgery  Do not apply any lotions/deodorants the morning of surgery.  Please wear clean clothes to the hospital.   Please read over the fact sheets that you were given.

## 2017-01-30 ENCOUNTER — Encounter (HOSPITAL_COMMUNITY)
Admission: RE | Admit: 2017-01-30 | Discharge: 2017-01-30 | Disposition: A | Discharge status: Home / Self Care | Payer: Medicare Other | Source: Ambulatory Visit | Attending: Orthopedic Surgery | Admitting: Orthopedic Surgery

## 2017-01-30 ENCOUNTER — Other Ambulatory Visit: Payer: Self-pay

## 2017-01-30 ENCOUNTER — Encounter (HOSPITAL_COMMUNITY): Payer: Self-pay

## 2017-01-30 DIAGNOSIS — Z96642 Presence of left artificial hip joint: Secondary | ICD-10-CM | POA: Diagnosis not present

## 2017-01-30 DIAGNOSIS — I10 Essential (primary) hypertension: Secondary | ICD-10-CM

## 2017-01-30 DIAGNOSIS — Z9841 Cataract extraction status, right eye: Secondary | ICD-10-CM | POA: Diagnosis not present

## 2017-01-30 DIAGNOSIS — Z96611 Presence of right artificial shoulder joint: Secondary | ICD-10-CM | POA: Diagnosis not present

## 2017-01-30 DIAGNOSIS — K760 Fatty (change of) liver, not elsewhere classified: Secondary | ICD-10-CM | POA: Diagnosis not present

## 2017-01-30 DIAGNOSIS — Z01812 Encounter for preprocedural laboratory examination: Secondary | ICD-10-CM | POA: Insufficient documentation

## 2017-01-30 DIAGNOSIS — Z720 Tobacco use: Secondary | ICD-10-CM | POA: Diagnosis not present

## 2017-01-30 DIAGNOSIS — Z79899 Other long term (current) drug therapy: Secondary | ICD-10-CM | POA: Diagnosis not present

## 2017-01-30 DIAGNOSIS — R7303 Prediabetes: Secondary | ICD-10-CM | POA: Diagnosis not present

## 2017-01-30 DIAGNOSIS — M19012 Primary osteoarthritis, left shoulder: Secondary | ICD-10-CM | POA: Diagnosis not present

## 2017-01-30 DIAGNOSIS — Z01810 Encounter for preprocedural cardiovascular examination: Secondary | ICD-10-CM

## 2017-01-30 DIAGNOSIS — Z961 Presence of intraocular lens: Secondary | ICD-10-CM | POA: Diagnosis not present

## 2017-01-30 HISTORY — DX: Fatty (change of) liver, not elsewhere classified: K76.0

## 2017-01-30 LAB — BASIC METABOLIC PANEL
ANION GAP: 7 (ref 5–15)
BUN: 15 mg/dL (ref 6–20)
CHLORIDE: 107 mmol/L (ref 101–111)
CO2: 25 mmol/L (ref 22–32)
Calcium: 9.5 mg/dL (ref 8.9–10.3)
Creatinine, Ser: 0.54 mg/dL — ABNORMAL LOW (ref 0.61–1.24)
GLUCOSE: 122 mg/dL — AB (ref 65–99)
POTASSIUM: 4.6 mmol/L (ref 3.5–5.1)
Sodium: 139 mmol/L (ref 135–145)

## 2017-01-30 LAB — HEPATIC FUNCTION PANEL
ALBUMIN: 3.8 g/dL (ref 3.5–5.0)
ALT: 20 U/L (ref 17–63)
AST: 23 U/L (ref 15–41)
Alkaline Phosphatase: 64 U/L (ref 38–126)
Bilirubin, Direct: <0.1 mg/dL — ABNORMAL LOW (ref 0.1–0.5)
TOTAL PROTEIN: 6.9 g/dL (ref 6.5–8.1)
Total Bilirubin: 0.4 mg/dL (ref 0.3–1.2)

## 2017-01-30 LAB — CBC
HEMATOCRIT: 47.2 % (ref 39.0–52.0)
HEMOGLOBIN: 15.1 g/dL (ref 13.0–17.0)
MCH: 30.3 pg (ref 26.0–34.0)
MCHC: 32 g/dL (ref 30.0–36.0)
MCV: 94.6 fL (ref 78.0–100.0)
Platelets: 199 10*3/uL (ref 150–400)
RBC: 4.99 MIL/uL (ref 4.22–5.81)
RDW: 13.7 % (ref 11.5–15.5)
WBC: 6.3 10*3/uL (ref 4.0–10.5)

## 2017-01-30 LAB — SURGICAL PCR SCREEN
MRSA, PCR: NEGATIVE
STAPHYLOCOCCUS AUREUS: POSITIVE — AB

## 2017-01-30 NOTE — Progress Notes (Signed)
   01/30/17 0925  OBSTRUCTIVE SLEEP APNEA  Have you ever been diagnosed with sleep apnea through a sleep study? No  Do you snore loudly (loud enough to be heard through closed doors)?  1  Do you often feel tired, fatigued, or sleepy during the daytime (such as falling asleep during driving or talking to someone)? 0  Has anyone observed you stop breathing during your sleep? 0  Do you have, or are you being treated for high blood pressure? 1  BMI more than 35 kg/m2? 0  Age > 50 (1-yes) 1  Neck circumference greater than:Male 16 inches or larger, Male 17inches or larger? 1  Male Gender (Yes=1) 1  Obstructive Sleep Apnea Score 5  Score 5 or greater  Results sent to PCP

## 2017-01-30 NOTE — Progress Notes (Signed)
Pt denies cardiac history, chest pain or sob. Pt denies being diabetic. Positive Stopbang assessment sent to pt's PCP.

## 2017-01-31 MED ORDER — TRANEXAMIC ACID 1000 MG/10ML IV SOLN
1000.0000 mg | INTRAVENOUS | Status: AC
  Administered 2017-02-01: 1000 mg via INTRAVENOUS
  Filled 2017-01-31: qty 10

## 2017-02-01 ENCOUNTER — Inpatient Hospital Stay (HOSPITAL_COMMUNITY): Payer: Medicare Other | Admitting: Emergency Medicine

## 2017-02-01 ENCOUNTER — Encounter (HOSPITAL_COMMUNITY): Payer: Self-pay | Admitting: Surgery

## 2017-02-01 ENCOUNTER — Inpatient Hospital Stay (HOSPITAL_COMMUNITY)
Admission: RE | Admit: 2017-02-01 | Discharge: 2017-02-02 | DRG: 483 | Disposition: A | Discharge status: Home / Self Care | Payer: Medicare Other | Source: Ambulatory Visit | Attending: Orthopedic Surgery | Admitting: Orthopedic Surgery

## 2017-02-01 ENCOUNTER — Other Ambulatory Visit: Payer: Self-pay

## 2017-02-01 ENCOUNTER — Encounter (HOSPITAL_COMMUNITY)
Admission: RE | Disposition: A | Discharge status: Home / Self Care | Payer: Self-pay | Source: Ambulatory Visit | Attending: Orthopedic Surgery

## 2017-02-01 ENCOUNTER — Inpatient Hospital Stay (HOSPITAL_COMMUNITY): Payer: Medicare Other

## 2017-02-01 DIAGNOSIS — Z96642 Presence of left artificial hip joint: Secondary | ICD-10-CM | POA: Diagnosis present

## 2017-02-01 DIAGNOSIS — Z79899 Other long term (current) drug therapy: Secondary | ICD-10-CM | POA: Diagnosis not present

## 2017-02-01 DIAGNOSIS — Z96611 Presence of right artificial shoulder joint: Secondary | ICD-10-CM | POA: Diagnosis present

## 2017-02-01 DIAGNOSIS — R7303 Prediabetes: Secondary | ICD-10-CM | POA: Diagnosis present

## 2017-02-01 DIAGNOSIS — Z961 Presence of intraocular lens: Secondary | ICD-10-CM | POA: Diagnosis present

## 2017-02-01 DIAGNOSIS — I10 Essential (primary) hypertension: Secondary | ICD-10-CM | POA: Diagnosis present

## 2017-02-01 DIAGNOSIS — Z9841 Cataract extraction status, right eye: Secondary | ICD-10-CM | POA: Diagnosis not present

## 2017-02-01 DIAGNOSIS — K760 Fatty (change of) liver, not elsewhere classified: Secondary | ICD-10-CM | POA: Diagnosis present

## 2017-02-01 DIAGNOSIS — Z720 Tobacco use: Secondary | ICD-10-CM

## 2017-02-01 DIAGNOSIS — Z96619 Presence of unspecified artificial shoulder joint: Secondary | ICD-10-CM

## 2017-02-01 DIAGNOSIS — M25512 Pain in left shoulder: Secondary | ICD-10-CM | POA: Diagnosis not present

## 2017-02-01 DIAGNOSIS — M19012 Primary osteoarthritis, left shoulder: Secondary | ICD-10-CM | POA: Diagnosis present

## 2017-02-01 DIAGNOSIS — G8918 Other acute postprocedural pain: Secondary | ICD-10-CM | POA: Diagnosis not present

## 2017-02-01 HISTORY — PX: TOTAL SHOULDER ARTHROPLASTY: SHX126

## 2017-02-01 LAB — GLUCOSE, CAPILLARY
Glucose-Capillary: 134 mg/dL — ABNORMAL HIGH (ref 65–99)
Glucose-Capillary: 141 mg/dL — ABNORMAL HIGH (ref 65–99)
Glucose-Capillary: 195 mg/dL — ABNORMAL HIGH (ref 65–99)

## 2017-02-01 SURGERY — ARTHROPLASTY, SHOULDER, TOTAL
Anesthesia: General | Site: Shoulder | Laterality: Left

## 2017-02-01 MED ORDER — ALUM & MAG HYDROXIDE-SIMETH 200-200-20 MG/5ML PO SUSP: 30.0000 mL | ORAL | Status: DC | PRN

## 2017-02-01 MED ORDER — LACTATED RINGERS IV SOLN
INTRAVENOUS | Status: DC | PRN
  Administered 2017-02-01 (×2): via INTRAVENOUS

## 2017-02-01 MED ORDER — METOCLOPRAMIDE HCL 5 MG/ML IJ SOLN: 5.0000 mg | Freq: Three times a day (TID) | INTRAMUSCULAR | Status: DC | PRN

## 2017-02-01 MED ORDER — DEXAMETHASONE SODIUM PHOSPHATE 10 MG/ML IJ SOLN
INTRAMUSCULAR | Status: DC | PRN
  Administered 2017-02-01: 10 mg via INTRAVENOUS

## 2017-02-01 MED ORDER — SODIUM CHLORIDE 0.9 % IV SOLN
INTRAVENOUS | Status: DC | PRN
  Administered 2017-02-01: 120 ug via INTRAVENOUS
  Administered 2017-02-01: 80 ug via INTRAVENOUS

## 2017-02-01 MED ORDER — PROPOFOL 10 MG/ML IV BOLUS
INTRAVENOUS | Status: DC | PRN
  Administered 2017-02-01: 200 mg via INTRAVENOUS

## 2017-02-01 MED ORDER — 0.9 % SODIUM CHLORIDE (POUR BTL) OPTIME
TOPICAL | Status: DC | PRN
  Administered 2017-02-01: 1000 mL

## 2017-02-01 MED ORDER — CEFAZOLIN SODIUM-DEXTROSE 2-4 GM/100ML-% IV SOLN
2.0000 g | INTRAVENOUS | Status: AC
  Administered 2017-02-01: 2 g via INTRAVENOUS
  Filled 2017-02-01: qty 100

## 2017-02-01 MED ORDER — DIPHENHYDRAMINE HCL 12.5 MG/5ML PO ELIX: 12.5000 mg | ORAL_SOLUTION | ORAL | Status: DC | PRN

## 2017-02-01 MED ORDER — PHENOL 1.4 % MT LIQD: 1.0000 | OROMUCOSAL | Status: DC | PRN

## 2017-02-01 MED ORDER — SUGAMMADEX SODIUM 200 MG/2ML IV SOLN
INTRAVENOUS | Status: AC
  Filled 2017-02-01: qty 2

## 2017-02-01 MED ORDER — MIDAZOLAM HCL 2 MG/2ML IJ SOLN: 0.5000 mg | Freq: Once | INTRAMUSCULAR | Status: DC | PRN

## 2017-02-01 MED ORDER — ROCURONIUM BROMIDE 10 MG/ML (PF) SYRINGE
PREFILLED_SYRINGE | INTRAVENOUS | Status: AC
  Filled 2017-02-01: qty 5

## 2017-02-01 MED ORDER — OXYCODONE HCL 5 MG PO TABS
5.0000 mg | ORAL_TABLET | ORAL | Status: DC | PRN
  Administered 2017-02-01: 5 mg via ORAL

## 2017-02-01 MED ORDER — ONDANSETRON HCL 4 MG/2ML IJ SOLN
INTRAMUSCULAR | Status: DC | PRN
  Administered 2017-02-01: 4 mg via INTRAVENOUS

## 2017-02-01 MED ORDER — FENTANYL CITRATE (PF) 100 MCG/2ML IJ SOLN
INTRAMUSCULAR | Status: AC
  Filled 2017-02-01: qty 2

## 2017-02-01 MED ORDER — CEFAZOLIN SODIUM-DEXTROSE 2-4 GM/100ML-% IV SOLN
2.0000 g | Freq: Four times a day (QID) | INTRAVENOUS | Status: AC
  Administered 2017-02-01 – 2017-02-02 (×3): 2 g via INTRAVENOUS
  Filled 2017-02-01 (×3): qty 100

## 2017-02-01 MED ORDER — LACTATED RINGERS IV SOLN
INTRAVENOUS | Status: DC
  Administered 2017-02-01: 19:00:00 via INTRAVENOUS

## 2017-02-01 MED ORDER — LACTATED RINGERS IV SOLN
INTRAVENOUS | Status: DC
  Administered 2017-02-01: 12:00:00 via INTRAVENOUS

## 2017-02-01 MED ORDER — FENTANYL CITRATE (PF) 100 MCG/2ML IJ SOLN
25.0000 ug | INTRAMUSCULAR | Status: DC | PRN
  Administered 2017-02-01 (×2): 25 ug via INTRAVENOUS

## 2017-02-01 MED ORDER — BUPIVACAINE-EPINEPHRINE (PF) 0.5% -1:200000 IJ SOLN
INTRAMUSCULAR | Status: DC | PRN
  Administered 2017-02-01: 30 mL via PERINEURAL

## 2017-02-01 MED ORDER — OXYCODONE HCL 5 MG PO TABS
ORAL_TABLET | ORAL | Status: AC
  Filled 2017-02-01: qty 1

## 2017-02-01 MED ORDER — ACETAMINOPHEN 650 MG RE SUPP: 650.0000 mg | RECTAL | Status: DC | PRN

## 2017-02-01 MED ORDER — DIAZEPAM 5 MG PO TABS
ORAL_TABLET | ORAL | Status: AC
  Filled 2017-02-01: qty 1

## 2017-02-01 MED ORDER — POLYETHYLENE GLYCOL 3350 17 G PO PACK: 17.0000 g | PACK | Freq: Every day | ORAL | Status: DC | PRN

## 2017-02-01 MED ORDER — FLEET ENEMA 7-19 GM/118ML RE ENEM: 1.0000 | ENEMA | Freq: Once | RECTAL | Status: DC | PRN

## 2017-02-01 MED ORDER — MIDAZOLAM HCL 2 MG/2ML IJ SOLN
INTRAMUSCULAR | Status: AC
  Administered 2017-02-01: 1 mg via INTRAVENOUS
  Filled 2017-02-01: qty 2

## 2017-02-01 MED ORDER — MENTHOL 3 MG MT LOZG
1.0000 | LOZENGE | OROMUCOSAL | Status: DC | PRN
  Administered 2017-02-02: 3 mg via ORAL
  Filled 2017-02-01: qty 9

## 2017-02-01 MED ORDER — DIAZEPAM 5 MG PO TABS
2.5000 mg | ORAL_TABLET | Freq: Four times a day (QID) | ORAL | Status: DC | PRN
  Administered 2017-02-01: 5 mg via ORAL

## 2017-02-01 MED ORDER — LIDOCAINE 2% (20 MG/ML) 5 ML SYRINGE
INTRAMUSCULAR | Status: AC
  Filled 2017-02-01: qty 5

## 2017-02-01 MED ORDER — FENTANYL CITRATE (PF) 100 MCG/2ML IJ SOLN
INTRAMUSCULAR | Status: DC | PRN
  Administered 2017-02-01: 100 ug via INTRAVENOUS
  Administered 2017-02-01 (×2): 50 ug via INTRAVENOUS

## 2017-02-01 MED ORDER — ONDANSETRON HCL 4 MG PO TABS: 4.0000 mg | ORAL_TABLET | Freq: Four times a day (QID) | ORAL | Status: DC | PRN

## 2017-02-01 MED ORDER — LIDOCAINE 2% (20 MG/ML) 5 ML SYRINGE
INTRAMUSCULAR | Status: AC
Start: 2017-02-01 — End: 2017-02-01
  Filled 2017-02-01: qty 10

## 2017-02-01 MED ORDER — ONDANSETRON HCL 4 MG/2ML IJ SOLN
INTRAMUSCULAR | Status: AC
  Filled 2017-02-01: qty 2

## 2017-02-01 MED ORDER — FENTANYL CITRATE (PF) 100 MCG/2ML IJ SOLN
INTRAMUSCULAR | Status: AC
  Administered 2017-02-01: 50 ug via INTRAVENOUS
  Filled 2017-02-01: qty 2

## 2017-02-01 MED ORDER — ONDANSETRON HCL 4 MG/2ML IJ SOLN
INTRAMUSCULAR | Status: AC
  Filled 2017-02-01: qty 4

## 2017-02-01 MED ORDER — FENTANYL CITRATE (PF) 250 MCG/5ML IJ SOLN
INTRAMUSCULAR | Status: AC
  Filled 2017-02-01: qty 5

## 2017-02-01 MED ORDER — HYDROMORPHONE HCL 1 MG/ML IJ SOLN: 1.0000 mg | INTRAMUSCULAR | Status: DC | PRN

## 2017-02-01 MED ORDER — METOCLOPRAMIDE HCL 5 MG PO TABS: 5.0000 mg | ORAL_TABLET | Freq: Three times a day (TID) | ORAL | Status: DC | PRN

## 2017-02-01 MED ORDER — LISINOPRIL 5 MG PO TABS
5.0000 mg | ORAL_TABLET | Freq: Every day | ORAL | Status: DC
  Administered 2017-02-02: 5 mg via ORAL
  Filled 2017-02-01: qty 1

## 2017-02-01 MED ORDER — DEXAMETHASONE SODIUM PHOSPHATE 10 MG/ML IJ SOLN
INTRAMUSCULAR | Status: AC
  Filled 2017-02-01: qty 2

## 2017-02-01 MED ORDER — PROMETHAZINE HCL 25 MG/ML IJ SOLN: 6.2500 mg | INTRAMUSCULAR | Status: DC | PRN

## 2017-02-01 MED ORDER — MEPERIDINE HCL 25 MG/ML IJ SOLN: 6.2500 mg | INTRAMUSCULAR | Status: DC | PRN

## 2017-02-01 MED ORDER — LIDOCAINE HCL (CARDIAC) 20 MG/ML IV SOLN
INTRAVENOUS | Status: DC | PRN
  Administered 2017-02-01: 30 mg via INTRAVENOUS

## 2017-02-01 MED ORDER — MIDAZOLAM HCL 2 MG/2ML IJ SOLN
1.0000 mg | Freq: Once | INTRAMUSCULAR | Status: AC
  Administered 2017-02-01: 1 mg via INTRAVENOUS

## 2017-02-01 MED ORDER — DEXAMETHASONE SODIUM PHOSPHATE 10 MG/ML IJ SOLN
INTRAMUSCULAR | Status: AC
  Filled 2017-02-01: qty 1

## 2017-02-01 MED ORDER — KETOROLAC TROMETHAMINE 15 MG/ML IJ SOLN
INTRAMUSCULAR | Status: AC
  Filled 2017-02-01: qty 1

## 2017-02-01 MED ORDER — ONDANSETRON HCL 4 MG/2ML IJ SOLN: 4.0000 mg | Freq: Four times a day (QID) | INTRAMUSCULAR | Status: DC | PRN

## 2017-02-01 MED ORDER — SUGAMMADEX SODIUM 200 MG/2ML IV SOLN
INTRAVENOUS | Status: DC | PRN
  Administered 2017-02-01: 197.2 mg via INTRAVENOUS

## 2017-02-01 MED ORDER — PROPOFOL 10 MG/ML IV BOLUS
INTRAVENOUS | Status: AC
  Filled 2017-02-01: qty 20

## 2017-02-01 MED ORDER — FENTANYL CITRATE (PF) 100 MCG/2ML IJ SOLN
50.0000 ug | Freq: Once | INTRAMUSCULAR | Status: AC
  Administered 2017-02-01: 50 ug via INTRAVENOUS

## 2017-02-01 MED ORDER — KETOROLAC TROMETHAMINE 15 MG/ML IJ SOLN
7.5000 mg | Freq: Four times a day (QID) | INTRAMUSCULAR | Status: AC
  Administered 2017-02-01 – 2017-02-02 (×4): 7.5 mg via INTRAVENOUS
  Filled 2017-02-01 (×3): qty 1

## 2017-02-01 MED ORDER — CHLORHEXIDINE GLUCONATE 4 % EX LIQD: 60.0000 mL | Freq: Once | CUTANEOUS | Status: DC

## 2017-02-01 MED ORDER — PHENYLEPHRINE HCL 10 MG/ML IJ SOLN
INTRAVENOUS | Status: DC | PRN
  Administered 2017-02-01: 50 ug/min via INTRAVENOUS

## 2017-02-01 MED ORDER — OXYCODONE HCL 5 MG PO TABS
10.0000 mg | ORAL_TABLET | ORAL | Status: DC | PRN
  Administered 2017-02-01 – 2017-02-02 (×2): 10 mg via ORAL
  Filled 2017-02-01 (×2): qty 2

## 2017-02-01 MED ORDER — EPHEDRINE 5 MG/ML INJ
INTRAVENOUS | Status: AC
  Filled 2017-02-01: qty 10

## 2017-02-01 MED ORDER — ROCURONIUM BROMIDE 10 MG/ML (PF) SYRINGE
PREFILLED_SYRINGE | INTRAVENOUS | Status: AC
  Filled 2017-02-01: qty 10

## 2017-02-01 MED ORDER — BISACODYL 5 MG PO TBEC: 5.0000 mg | DELAYED_RELEASE_TABLET | Freq: Every day | ORAL | Status: DC | PRN

## 2017-02-01 MED ORDER — ACETAMINOPHEN 325 MG PO TABS: 650.0000 mg | ORAL_TABLET | ORAL | Status: DC | PRN

## 2017-02-01 MED ORDER — DOCUSATE SODIUM 100 MG PO CAPS
100.0000 mg | ORAL_CAPSULE | Freq: Two times a day (BID) | ORAL | Status: DC
  Administered 2017-02-01 – 2017-02-02 (×2): 100 mg via ORAL
  Filled 2017-02-01 (×2): qty 1

## 2017-02-01 MED ORDER — ROCURONIUM BROMIDE 100 MG/10ML IV SOLN
INTRAVENOUS | Status: DC | PRN
  Administered 2017-02-01: 50 mg via INTRAVENOUS

## 2017-02-01 MED ORDER — PHENYLEPHRINE 40 MCG/ML (10ML) SYRINGE FOR IV PUSH (FOR BLOOD PRESSURE SUPPORT)
PREFILLED_SYRINGE | INTRAVENOUS | Status: AC
  Filled 2017-02-01: qty 10

## 2017-02-01 SURGICAL SUPPLY — 60 items
BLADE SAW SGTL 83.5X18.5 (BLADE) ×3 IMPLANT
CEMENT BONE DEPUY (Cement) ×3 IMPLANT
COVER SURGICAL LIGHT HANDLE (MISCELLANEOUS) ×3 IMPLANT
DERMABOND ADHESIVE PROPEN (GAUZE/BANDAGES/DRESSINGS) ×2
DERMABOND ADVANCED .7 DNX6 (GAUZE/BANDAGES/DRESSINGS) ×1 IMPLANT
DRAPE ORTHO SPLIT 77X108 STRL (DRAPES) ×4
DRAPE SURG 17X11 SM STRL (DRAPES) ×3 IMPLANT
DRAPE SURG ORHT 6 SPLT 77X108 (DRAPES) ×2 IMPLANT
DRAPE U-SHAPE 47X51 STRL (DRAPES) ×3 IMPLANT
DRSG AQUACEL AG ADV 3.5X10 (GAUZE/BANDAGES/DRESSINGS) ×3 IMPLANT
DURAPREP 26ML APPLICATOR (WOUND CARE) ×3 IMPLANT
ELECT BLADE 4.0 EZ CLEAN MEGAD (MISCELLANEOUS) ×3
ELECT CAUTERY BLADE 6.4 (BLADE) ×3 IMPLANT
ELECT REM PT RETURN 9FT ADLT (ELECTROSURGICAL) ×3
ELECTRODE BLDE 4.0 EZ CLN MEGD (MISCELLANEOUS) ×1 IMPLANT
ELECTRODE REM PT RTRN 9FT ADLT (ELECTROSURGICAL) ×1 IMPLANT
FACESHIELD WRAPAROUND (MASK) ×9 IMPLANT
GLENOID UNI VAULTLOCK LRG (Shoulder) ×3 IMPLANT
GLOVE BIO SURGEON STRL SZ7.5 (GLOVE) ×3 IMPLANT
GLOVE BIO SURGEON STRL SZ8 (GLOVE) ×3 IMPLANT
GLOVE EUDERMIC 7 POWDERFREE (GLOVE) ×3 IMPLANT
GLOVE SS BIOGEL STRL SZ 7.5 (GLOVE) ×1 IMPLANT
GLOVE SUPERSENSE BIOGEL SZ 7.5 (GLOVE) ×2
GOWN STRL REUS W/ TWL LRG LVL3 (GOWN DISPOSABLE) ×1 IMPLANT
GOWN STRL REUS W/ TWL XL LVL3 (GOWN DISPOSABLE) ×2 IMPLANT
GOWN STRL REUS W/TWL LRG LVL3 (GOWN DISPOSABLE) ×2
GOWN STRL REUS W/TWL XL LVL3 (GOWN DISPOSABLE) ×4
HEAD HUMERAL UNIVERS II 50/19 (Head) ×3 IMPLANT
KIT BASIN OR (CUSTOM PROCEDURE TRAY) ×3 IMPLANT
KIT ROOM TURNOVER OR (KITS) ×3 IMPLANT
KIT SET UNIVERSAL (KITS) ×3 IMPLANT
MANIFOLD NEPTUNE II (INSTRUMENTS) ×3 IMPLANT
NDL SUT .5 MAYO 1.404X.05X (NEEDLE) ×1 IMPLANT
NDL SUT 6 .5 CRC .975X.05 MAYO (NEEDLE) ×1 IMPLANT
NEEDLE MAYO TAPER (NEEDLE) ×4
NS IRRIG 1000ML POUR BTL (IV SOLUTION) ×3 IMPLANT
PACK SHOULDER (CUSTOM PROCEDURE TRAY) ×3 IMPLANT
PAD ARMBOARD 7.5X6 YLW CONV (MISCELLANEOUS) ×6 IMPLANT
RESTRAINT HEAD UNIVERSAL NS (MISCELLANEOUS) ×3 IMPLANT
SLEEVE SURGEON STRL (DRAPES) ×3 IMPLANT
SLING ARM FOAM STRAP LRG (SOFTGOODS) IMPLANT
SLING ARM FOAM STRAP XLG (SOFTGOODS) ×3 IMPLANT
SLING ARM XL FOAM STRAP (SOFTGOODS) IMPLANT
SMARTMIX MINI TOWER (MISCELLANEOUS) ×3
SPONGE LAP 18X18 X RAY DECT (DISPOSABLE) ×3 IMPLANT
SPONGE LAP 4X18 X RAY DECT (DISPOSABLE) ×3 IMPLANT
STEM HUMERAL UNI APEX 10MM (Shoulder) ×3 IMPLANT
SUCTION FRAZIER HANDLE 10FR (MISCELLANEOUS) ×2
SUCTION TUBE FRAZIER 10FR DISP (MISCELLANEOUS) ×1 IMPLANT
SUT FIBERWIRE #2 38 T-5 BLUE (SUTURE) ×12
SUT MNCRL AB 3-0 PS2 18 (SUTURE) ×3 IMPLANT
SUT MON AB 2-0 CT1 36 (SUTURE) ×3 IMPLANT
SUT VIC AB 1 CT1 27 (SUTURE) ×2
SUT VIC AB 1 CT1 27XBRD ANBCTR (SUTURE) ×1 IMPLANT
SUTURE FIBERWR #2 38 T-5 BLUE (SUTURE) ×4 IMPLANT
SYR CONTROL 10ML LL (SYRINGE) IMPLANT
TOWEL OR 17X24 6PK STRL BLUE (TOWEL DISPOSABLE) ×3 IMPLANT
TOWEL OR 17X26 10 PK STRL BLUE (TOWEL DISPOSABLE) ×3 IMPLANT
TOWER SMARTMIX MINI (MISCELLANEOUS) ×1 IMPLANT
WATER STERILE IRR 1000ML POUR (IV SOLUTION) ×3 IMPLANT

## 2017-02-01 NOTE — Anesthesia Postprocedure Evaluation (Signed)
Anesthesia Post Note  Patient: Douglas Rasmussen  Procedure(s) Performed: TOTAL SHOULDER ARTHROPLASTY (Left Shoulder)     Patient location during evaluation: PACU Anesthesia Type: General and Regional Level of consciousness: awake and alert Pain management: pain level controlled (no pain) Vital Signs Assessment: post-procedure vital signs reviewed and stable Respiratory status: spontaneous breathing, nonlabored ventilation, respiratory function stable and patient connected to nasal cannula oxygen Cardiovascular status: blood pressure returned to baseline and stable Postop Assessment: no apparent nausea or vomiting Anesthetic complications: no    Last Vitals:  Vitals:   02/01/17 1754 02/01/17 1758  BP:    Pulse:    Resp:  20  Temp: 36.5 C   SpO2:      Last Pain:  Vitals:   02/01/17 1454  TempSrc:   PainSc: 0-No pain                 Kadeidra Coryell,E. Schneider Warchol

## 2017-02-01 NOTE — Anesthesia Preprocedure Evaluation (Addendum)
Anesthesia Evaluation  Patient identified by MRN, date of birth, ID band Patient awake    Reviewed: Allergy & Precautions, NPO status   History of Anesthesia Complications Negative for: history of anesthetic complications  Airway Mallampati: II  TM Distance: >3 FB Neck ROM: Full    Dental  (+) Teeth Intact   Pulmonary neg pulmonary ROS,    breath sounds clear to auscultation       Cardiovascular hypertension,  Rhythm:Regular Rate:Normal     Neuro/Psych negative neurological ROS     GI/Hepatic Neg liver ROS,   Endo/Other  negative endocrine ROSMorbid obesity  Renal/GU negative Renal ROS     Musculoskeletal  (+) Arthritis ,   Abdominal (+) + obese,   Peds  Hematology negative hematology ROS (+)   Anesthesia Other Findings   Reproductive/Obstetrics                           Anesthesia Physical Anesthesia Plan  ASA: II  Anesthesia Plan: General   Post-op Pain Management:  Regional for Post-op pain   Induction:   PONV Risk Score and Plan: 3 and Ondansetron, Dexamethasone and Midazolam  Airway Management Planned: Oral ETT  Additional Equipment:   Intra-op Plan:   Post-operative Plan: Extubation in OR  Informed Consent: I have reviewed the patients History and Physical, chart, labs and discussed the procedure including the risks, benefits and alternatives for the proposed anesthesia with the patient or authorized representative who has indicated his/her understanding and acceptance.   Dental advisory given  Plan Discussed with: CRNA and Surgeon  Anesthesia Plan Comments: (Plan routine monitors, GETA with interscalene block for post op analgesia)       Anesthesia Quick Evaluation

## 2017-02-01 NOTE — Anesthesia Procedure Notes (Signed)
Anesthesia Regional Block: Interscalene brachial plexus block   Pre-Anesthetic Checklist: ,, timeout performed, Correct Patient, Correct Site, Correct Laterality, Correct Procedure, Correct Position, site marked, Risks and benefits discussed,  Surgical consent,  Pre-op evaluation,  At surgeon's request and post-op pain management  Laterality: Left and Upper  Prep: chloraprep       Needles:  Injection technique: Single-shot  Needle Type: Echogenic Stimulator Needle     Needle Length: 4cm  Needle Gauge: 21     Additional Needles:   Procedures:, nerve stimulator,,,,,,,   Nerve Stimulator or Paresthesia:  Response: forearm twitch, 0.44 mA, 0.1 ms,   Additional Responses:   Narrative:  Start time: 02/01/2017 2:36 PM End time: 02/01/2017 2:44 PM Injection made incrementally with aspirations every 5 mL.  Performed by: Personally  Anesthesiologist: Annye Asa, MD  Additional Notes: Pt identified in Holding room.  Monitors applied. Working IV access confirmed. Sterile prep L clavicle and neck.  Unable to visualize well with [place], so #21ga PNS to forearm twitch at 0.51mA threshold.  30cc 0.5% Bupivacaine with 1:200k epi injected incrementally after negative test dose.  Patient asymptomatic, VSS, no heme aspirated, tolerated well.  Jenita Seashore, MD

## 2017-02-01 NOTE — Transfer of Care (Signed)
Immediate Anesthesia Transfer of Care Note  Patient: Douglas Rasmussen  Procedure(s) Performed: TOTAL SHOULDER ARTHROPLASTY (Left Shoulder)  Patient Location: PACU  Anesthesia Type:GA combined with regional for post-op pain  Level of Consciousness: awake and alert   Airway & Oxygen Therapy: Patient Spontanous Breathing and Patient connected to nasal cannula oxygen  Post-op Assessment: Report given to RN and Post -op Vital signs reviewed and stable  Post vital signs: Reviewed and stable  Last Vitals:  Vitals:   02/01/17 1454 02/01/17 1754  BP: 130/73   Pulse: 81   Resp: 17   Temp:  36.5 C  SpO2: 98%     Last Pain:  Vitals:   02/01/17 1454  TempSrc:   PainSc: 0-No pain      Patients Stated Pain Goal: 3 (95/39/67 2897)  Complications: No apparent anesthesia complications

## 2017-02-01 NOTE — Op Note (Signed)
02/01/2017  5:30 PM  PATIENT:   Douglas Rasmussen  71 y.o. male  PRE-OPERATIVE DIAGNOSIS:  Left shoulder osteoarthritis  POST-OPERATIVE DIAGNOSIS:  same  PROCEDURE:  L TSA #10 stem, large glenoid, 50x19 head  SURGEON:  Anelise Staron, Metta Clines M.D.  ASSISTANTS: Shuford pac   ANESTHESIA:   GET +ISB  EBL: 200  SPECIMEN:  none  Drains: none   PATIENT DISPOSITION:  PACU - hemodynamically stable.    PLAN OF CARE: Admit for overnight observation  Dictation# [id number]   Contact # [phone]

## 2017-02-01 NOTE — Discharge Instructions (Signed)

## 2017-02-01 NOTE — Anesthesia Procedure Notes (Signed)
Procedure Name: Intubation Date/Time: 02/01/2017 3:39 PM Performed by: Eligha Bridegroom, CRNA Pre-anesthesia Checklist: Patient identified, Emergency Drugs available, Suction available, Patient being monitored and Timeout performed Patient Re-evaluated:Patient Re-evaluated prior to induction Oxygen Delivery Method: Circle system utilized Preoxygenation: Pre-oxygenation with 100% oxygen Induction Type: IV induction Ventilation: Mask ventilation without difficulty and Oral airway inserted - appropriate to patient size Laryngoscope Size: Mac and 4 Grade View: Grade III Tube type: Oral Tube size: 7.5 mm Number of attempts: 1 Airway Equipment and Method: Stylet Secured at: 22 cm Dental Injury: Teeth and Oropharynx as per pre-operative assessment

## 2017-02-01 NOTE — H&P (Signed)
Douglas Rasmussen    Chief Complaint: Left shoulder osteoarthritis HPI: The patient is a 71 y.o. male with end stage left shoulder OA  Past Medical History:  Diagnosis Date  . Arthritis    "qwhere" (01/13/2016)  . Fatty liver   . H/O cardiovascular stress test 12/2015  . History of kidney stones    last episode 2-3 yrs. ago, but also remarks that he has had cystocopy & lithotripsy   . Hypertension   . Pre-diabetes     Past Surgical History:  Procedure Laterality Date  . CARPAL TUNNEL RELEASE Bilateral 2003-2004   left-right  . CATARACT EXTRACTION W/PHACO Right 10/29/2014   Procedure: CATARACT EXTRACTION PHACO AND INTRAOCULAR LENS PLACEMENT RIGHT EYE CDE=6.86;  Surgeon: Douglas Branch, MD;  Location: [facility];  Service: Ophthalmology;  Laterality: Right;  . CATARACT EXTRACTION W/PHACO Left 11/26/2014   Procedure: CATARACT EXTRACTION PHACO AND INTRAOCULAR LENS PLACEMENT (IOC);  Surgeon: Douglas Branch, MD;  Location: [facility];  Service: Ophthalmology;  Laterality: Left;  CDE:5.33  . COLONOSCOPY    . CYSTOSCOPY/RETROGRADE/URETEROSCOPY/STONE EXTRACTION WITH BASKET  X 2  . JOINT REPLACEMENT    . [place]  . LITHOTRIPSY    . TONSILLECTOMY  1958  . TOTAL HIP ARTHROPLASTY Left 2012  . TOTAL SHOULDER ARTHROPLASTY Right 01/13/2016  . TOTAL SHOULDER ARTHROPLASTY Right 01/13/2016   Procedure: RIGHT TOTAL SHOULDER ARTHROPLASTY;  Surgeon: Douglas Britain, MD;  Location: [facility];  Service: Orthopedics;  Laterality: Right;    Family History  Problem Relation Age of Onset  . Diabetes Mother   . Heart disease Mother   . Arthritis Father   . Alcoholism Father     Social History:  reports that  has never smoked. His smokeless tobacco use includes snuff. He reports that he drinks about 33.6 oz of alcohol per week. He reports that he does not use drugs.   Medications Prior to Admission  Medication Sig Dispense Refill  . Aspirin-Salicylamide-Caffeine (BC HEADACHE POWDER PO) Take 1 packet by mouth  daily.    Marland Kitchen CINNAMON PO Take 1 capsule by mouth daily.    . Coenzyme Q10 (CO Q 10 PO) Take 1 capsule by mouth daily.     Marland Kitchen lisinopril (PRINIVIL,ZESTRIL) 5 MG tablet Take 5 mg by mouth daily.    . Multiple Vitamins-Minerals (CENTRUM SILVER PO) Take 1 tablet by mouth every other day.     . Omega-3 Fatty Acids (FISH OIL PO) Take 1 capsule by mouth daily.    Douglas Rasmussen Glycol-Propyl Glycol (SYSTANE OP) Place 1 drop into both eyes daily.    . diazepam (VALIUM) 5 MG tablet Take 0.5-1 tablets (2.5-5 mg total) by mouth every 6 (six) hours as needed for muscle spasms or sedation. (Patient not taking: Reported on 01/23/2017) 40 tablet 1  . HYDROmorphone (DILAUDID) 2 MG tablet Take 1-2 tablets (2-4 mg total) by mouth every 4 (four) hours as needed (for pain not controlled by percocet). (Patient not taking: Reported on 01/23/2017) 30 tablet 0  . ondansetron (ZOFRAN) 4 MG tablet Take 1 tablet (4 mg total) by mouth every 8 (eight) hours as needed for nausea or vomiting. (Patient not taking: Reported on 01/23/2017) 20 tablet 0  . oxyCODONE-acetaminophen (PERCOCET) 5-325 MG tablet Take 1-2 tablets by mouth every 4 (four) hours as needed. (Patient not taking: Reported on 01/23/2017) 50 tablet 0     Physical Exam: left shoulder with painful and restricted motion as noted at recent office visits  Vitals  Temp:  [98.1 F (36.7 C)] 98.1 F (36.7 C) (11/29 1127) Pulse Rate:  [75] 75 (11/29 1127) Resp:  [18] 18 (11/29 1127) BP: (121)/(70) 121/70 (11/29 1127) SpO2:  [96 %] 96 % (11/29 1127) Weight:  [98.6 kg (217 lb 4.8 oz)] 98.6 kg (217 lb 4.8 oz) (11/29 1127)  Assessment/Plan  Impression: Left shoulder osteoarthritis  Plan of Action: Procedure(s): TOTAL SHOULDER ARTHROPLASTY  Douglas Rasmussen M Douglas Rasmussen 02/01/2017, 2:41 PM Contact # [phone]

## 2017-02-02 ENCOUNTER — Other Ambulatory Visit: Payer: Self-pay

## 2017-02-02 MED ORDER — ONDANSETRON HCL 4 MG PO TABS: 4.0000 mg | ORAL_TABLET | Freq: Three times a day (TID) | ORAL | 0 refills | Status: DC | PRN

## 2017-02-02 MED ORDER — DIAZEPAM 5 MG PO TABS: 2.5000 mg | ORAL_TABLET | Freq: Four times a day (QID) | ORAL | 1 refills | Status: DC | PRN

## 2017-02-02 MED ORDER — NAPROXEN 500 MG PO TABS: 500.0000 mg | ORAL_TABLET | Freq: Two times a day (BID) | ORAL | 1 refills | Status: DC

## 2017-02-02 MED ORDER — OXYCODONE-ACETAMINOPHEN 5-325 MG PO TABS: 1.0000 | ORAL_TABLET | ORAL | 0 refills | Status: DC | PRN

## 2017-02-02 NOTE — Plan of Care (Signed)
  Progressing Activity: Risk for activity intolerance will decrease 02/02/2017 0505 - Progressing by West Pugh, RN Coping: Level of anxiety will decrease 02/02/2017 0505 - Progressing by West Pugh, RN Pain Managment: General experience of comfort will improve 02/02/2017 0505 - Progressing by West Pugh, RN Safety: Ability to remain free from injury will improve 02/02/2017 0505 - Progressing by West Pugh, RN Skin Integrity: Risk for impaired skin integrity will decrease 02/02/2017 0505 - Progressing by West Pugh, RN   Nutrition: Adequate nutrition will be maintained 02/02/2017 0505 - Adequate for Discharge by West Pugh, RN

## 2017-02-02 NOTE — Evaluation (Signed)
Occupational Therapy Evaluation Patient Details Name: Douglas Rasmussen MRN: [id number] DOB: [birth year] Today's Date: 02/02/2017    History of Present Illness s/p L TOTAL SHOULDER ARTHROPLASTY. PMH includes Right shoulder replacement. Pt  has a past medical history of Arthritis; Chronic kidney disease; Hypertension; L hip replacement (2012); and Pre-diabetes.   Clinical Impression   Pt admitted with the above diagnoses and presents with below problem list. PTA pt was independent with ADLs. Pt currently min A for most ADLs, supervision to min guard with functional transfers and mobility. Will have family to assist at home. All OT education completed. No further acute OT needs at this point. Pt awaiting d/c home today.       Follow Up Recommendations  DC plan and follow up therapy as arranged by surgeon    Equipment Recommendations  None recommended by OT    Recommendations for Other Services       Precautions / Restrictions Precautions Precautions: Shoulder Type of Shoulder Precautions: passive Shoulder Interventions: Shoulder sling/immobilizer;Off for dressing/bathing/exercises(off in controlled environment) Precaution Booklet Issued: Yes (comment) Required Braces or Orthoses: Sling Restrictions Weight Bearing Restrictions: Yes LUE Weight Bearing: Non weight bearing      Mobility Bed Mobility Overal bed mobility: Needs Assistance Bed Mobility: Supine to Sit;Sit to Supine     Supine to sit: Mod assist;HOB elevated Sit to supine: Min guard;HOB elevated   General bed mobility comments: assist to powerup trunk to EOB. plans to sleep in recliner. has adjustable bed also.  Transfers Overall transfer level: Needs assistance Equipment used: None Transfers: Sit to/from Stand Sit to Stand: Supervision         General transfer comment: from EOB and toilet    Balance Overall balance assessment: Needs assistance         Standing balance support: No upper extremity  supported Standing balance-Leahy Scale: Good                             ADL either performed or assessed with clinical judgement   ADL Overall ADL's : Needs assistance/impaired Eating/Feeding: Set up;Sitting   Grooming: Minimal assistance;Sitting;Standing   Upper Body Bathing: Minimal assistance;Sitting   Lower Body Bathing: Minimal assistance;Sit to/from stand   Upper Body Dressing : Minimal assistance;Sitting   Lower Body Dressing: Minimal assistance;Sit to/from stand   Toilet Transfer: Min guard;Ambulation;Comfort height toilet;Grab bars   Toileting- Clothing Manipulation and Hygiene: Minimal assistance;Sit to/from stand   Tub/ Shower Transfer: Tub transfer;Minimal assistance;Ambulation;3 in 1 Tub/Shower Transfer Details (indicate cue type and reason): plans to sponge bath initially Functional mobility during ADLs: Supervision/safety General ADL Comments: Pt completed bed mobility, toilet transfer, LB dressing and practiced sling donning/doffing. Reviewed shoulder education. Spouse present.     Vision         Perception     Praxis      Pertinent Vitals/Pain Pain Assessment: Faces Faces Pain Scale: Hurts even more Pain Location: L shoulder at end of session Pain Descriptors / Indicators: Sore;Aching Pain Intervention(s): Limited activity within patient's tolerance;Monitored during session;Repositioned;Patient requesting pain meds-RN notified     Hand Dominance Right   Extremity/Trunk Assessment Upper Extremity Assessment Upper Extremity Assessment: LUE deficits/detail LUE Deficits / Details: s/p TOTAL SHOULDER ARTHROPLASTY   Lower Extremity Assessment Lower Extremity Assessment: Overall WFL for tasks assessed       Communication Communication Communication: No difficulties   Cognition Arousal/Alertness: Awake/alert Behavior During Therapy: WFL for tasks assessed/performed Overall Cognitive Status: Within  Functional Limits for tasks  assessed                                     General Comments       Exercises Exercises: Other exercises Other Exercises Other Exercises: Pt completed 3x pendulum exercises in standing position. Limited by pain. Educated on e/w/h. exercises. Provided handout.    Shoulder Instructions      Home Living Family/patient expects to be discharged to:: Private residence Living Arrangements: Spouse/significant other Available Help at Discharge: Family;Available 24 hours/day Type of Home: House Home Access: Stairs to enter [employer] of Steps: 3 Entrance Stairs-Rails: None Home Layout: One level     Bathroom Shower/Tub: Teacher, early years/pre: Handicapped height     Home Equipment: Environmental consultant - 2 wheels;Cane - single point;Bedside commode          Prior Functioning/Environment Level of Independence: Independent                 OT Problem List: Impaired balance (sitting and/or standing);Decreased knowledge of use of DME or AE;Decreased knowledge of precautions;Pain;Impaired UE functional use      OT Treatment/Interventions:      OT Goals(Current goals can be found in the care plan section) Acute Rehab OT Goals Patient Stated Goal: home  OT Frequency:     Barriers to D/C:            Co-evaluation              AM-PAC PT "6 Clicks" Daily Activity     Outcome Measure Help from another person eating meals?: None Help from another person taking care of personal grooming?: A Little Help from another person toileting, which includes using toliet, bedpan, or urinal?: A Little Help from another person bathing (including washing, rinsing, drying)?: A Little Help from another person to put on and taking off regular upper body clothing?: A Little Help from another person to put on and taking off regular lower body clothing?: A Little 6 Click Score: 19   End of Session Equipment Utilized During Treatment: Other  (comment)(sling) Nurse Communication: Patient requests pain meds  Activity Tolerance: Patient tolerated treatment well;Other (comment)(increased pain level at end of session) Patient left: in bed;with call bell/phone within reach;with family/visitor present  OT Visit Diagnosis: Unsteadiness on feet (R26.81);Pain Pain - Right/Left: Left Pain - part of body: Shoulder                Time: 5809-9833 OT Time Calculation (min): 37 min Charges:  OT General Charges $OT Visit: 1 Visit OT Evaluation $OT Eval Low Complexity: 1 Low OT Treatments $Self Care/Home Management : 8-22 mins G-Codes:       Hortencia Pilar 02/02/2017, 10:44 AM

## 2017-02-02 NOTE — Discharge Summary (Signed)
PATIENT ID:      Douglas Rasmussen  MRN:     [id number] DOB/AGE:    [birth year] / 71 y.o.     DISCHARGE SUMMARY  ADMISSION DATE:    02/01/2017 DISCHARGE DATE:    ADMISSION DIAGNOSIS: Left shoulder osteoarthritis Past Medical History:  Diagnosis Date  . Arthritis    "qwhere" (01/13/2016)  . Fatty liver   . H/O cardiovascular stress test 12/2015  . History of kidney stones    last episode 2-3 yrs. ago, but also remarks that he has had cystocopy & lithotripsy   . Hypertension   . Pre-diabetes     DISCHARGE DIAGNOSIS:   Active Problems:   Status post total shoulder arthroplasty   PROCEDURE: Procedure(s): TOTAL SHOULDER ARTHROPLASTY on 02/01/2017  CONSULTS:    HISTORY:  See H&P in chart.  HOSPITAL COURSE:  [name] is a 71 y.o. admitted on 02/01/2017 with a diagnosis of Left shoulder osteoarthritis.  They were brought to the operating room on 02/01/2017 and underwent Procedure(s): TOTAL SHOULDER ARTHROPLASTY.    They were given perioperative antibiotics:  Anti-infectives (From admission, onward)   Start     Dose/Rate Route Frequency Ordered Stop   02/01/17 2130  ceFAZolin (ANCEF) IVPB 2g/100 mL premix     2 g 200 mL/hr over 30 Minutes Intravenous Every 6 hours 02/01/17 2003 02/02/17 1529   02/01/17 1300  ceFAZolin (ANCEF) IVPB 2g/100 mL premix     2 g 200 mL/hr over 30 Minutes Intravenous To [facility] 02/01/17 0702 02/01/17 1530    .  Patient underwent the above named procedure and tolerated it well. The following day they were hemodynamically stable and pain was controlled on oral analgesics. They were neurovascularly intact to the operative extremity. OT was ordered and worked with patient per protocol. They were medically and orthopaedically stable for discharge on day 1.    DIAGNOSTIC STUDIES:  RECENT RADIOGRAPHIC STUDIES :  No results found.  RECENT VITAL SIGNS:   Patient Vitals for the past 24 hrs:  BP Temp Temp src Pulse Resp SpO2 Height Weight   02/02/17 0810 (!) 145/87 98.3 F (36.8 C) Oral 92 - 95 % - -  02/02/17 0431 133/83 98.2 F (36.8 C) Oral (!) 109 18 94 % - -  02/01/17 2350 136/64 98.4 F (36.9 C) Oral (!) 111 19 92 % - -  02/01/17 2009 (!) 146/80 98.4 F (36.9 C) Oral 97 18 93 % - -  02/01/17 1945 - (!) 97.2 F (36.2 C) - - - - - -  02/01/17 1930 - - - 94 19 97 % - -  02/01/17 1915 - - - 90 18 99 % - -  02/01/17 1903 138/72 - - - - - - -  02/01/17 1853 - - - 90 20 100 % - -  02/01/17 1839 - - - 93 18 99 % - -  02/01/17 1830 - - - 88 19 99 % - -  02/01/17 1822 (!) 144/88 - - - - - - -  02/01/17 1815 139/82 - - 92 18 97 % - -  02/01/17 1809 - - - 96 (!) 22 98 % - -  02/01/17 1758 - - - - 20 - - -  02/01/17 1754 - 97.7 F (36.5 C) - - - - - -  02/01/17 1454 130/73 - - 81 17 98 % - -  02/01/17 1450 - - - 77 (!) 25 97 % - -  02/01/17 1445 - - -  79 (!) 22 97 % - -  02/01/17 1440 - - - 76 (!) 24 96 % - -  02/01/17 1435 115/68 - - 75 (!) 25 98 % - -  02/01/17 1430 - - - - (!) 23 - - -  02/01/17 1127 121/70 98.1 F (36.7 C) Oral 75 18 96 % 5' 7.5" (1.715 m) 98.6 kg (217 lb 4.8 oz)  .  RECENT EKG RESULTS:    Orders placed or performed during the hospital encounter of 01/30/17  . EKG 12-Lead  . EKG 12-Lead    DISCHARGE INSTRUCTIONS:    DISCHARGE MEDICATIONS:   Allergies as of 02/02/2017   No Known Allergies     Medication List    STOP taking these medications   HYDROmorphone 2 MG tablet Commonly known as:  DILAUDID     TAKE these medications   BC HEADACHE POWDER PO Take 1 packet by mouth daily.   CENTRUM SILVER PO Take 1 tablet by mouth every other day.   CINNAMON PO Take 1 capsule by mouth daily.   CO Q 10 PO Take 1 capsule by mouth daily.   diazepam 5 MG tablet Commonly known as:  VALIUM Take 0.5-1 tablets (2.5-5 mg total) by mouth every 6 (six) hours as needed for muscle spasms or sedation.   FISH OIL PO Take 1 capsule by mouth daily.   lisinopril 5 MG tablet Commonly known as:   PRINIVIL,ZESTRIL Take 5 mg by mouth daily.   naproxen 500 MG tablet Commonly known as:  NAPROSYN Take 1 tablet (500 mg total) by mouth 2 (two) times daily with a meal.   ondansetron 4 MG tablet Commonly known as:  ZOFRAN Take 1 tablet (4 mg total) by mouth every 8 (eight) hours as needed for nausea or vomiting. What changed:  Another medication with the same name was added. Make sure you understand how and when to take each.   ondansetron 4 MG tablet Commonly known as:  ZOFRAN Take 1 tablet (4 mg total) by mouth every 8 (eight) hours as needed for nausea or vomiting. What changed:  You were already taking a medication with the same name, and this prescription was added. Make sure you understand how and when to take each.   oxyCODONE-acetaminophen 5-325 MG tablet Commonly known as:  PERCOCET Take 1-2 tablets by mouth every 4 (four) hours as needed.   SYSTANE OP Place 1 drop into both eyes daily.       FOLLOW UP VISIT:   Follow-up Information    Justice Britain, MD.   Specialty:  Orthopedic Surgery Why:  call to be seen in 10-14 days Contact information: [address] [place] [address] [phone]           DISCHARGE TO: Home   DISCHARGE CONDITION:  Festus Barren for Dr. Justice Britain 02/02/2017, 8:18 AM

## 2017-02-02 NOTE — Progress Notes (Signed)
Patient discharged per orders. Patient's wife at bedside for d/c orders/instructions/teaching. Medications, prescriptions, follow up appointments, home care, wound care, ROM exercises discussed. Time allowed for questions/concerns. Patient interested in tobacco cessation. [facility] cessation services discussed.  Patient will leave the unit via wheelchair with a volunteer. - Roselyn Reef Amos Micheals,RN

## 2017-02-02 NOTE — Op Note (Signed)
NAME:  Douglas Rasmussen, Douglas Rasmussen                       ACCOUNT NO.:  MEDICAL RECORD NO.:  [id number]  LOCATION:                                 FACILITY:  PHYSICIAN:  Metta Clines. Mansfield Dann, M.D.       DATE OF BIRTH:  DATE OF PROCEDURE:  02/01/2017 DATE OF DISCHARGE:                              OPERATIVE REPORT   PREOPERATIVE DIAGNOSIS:  End-stage left shoulder osteoarthritis.  POSTOPERATIVE DIAGNOSIS:  End-stage left shoulder osteoarthritis.  PROCEDURE:  Left total shoulder arthroplasty utilizing a press-fit size 10 Arthrex stem, a large glenoid, and a 50 x 19 eccentric head.  SURGEON:  Metta Clines. Tayden Duran, M.D.  Terrence DupontOlivia Mackie A. Shuford, P.A.-C.  ANESTHESIA:  General endotracheal as well as interscalene block.  ESTIMATED BLOOD LOSS:  200 mL.  DRAINS:  None.  HISTORY:  Mr. Douglas Rasmussen is a 71 year old gentleman, who has had chronic and progressive increasing left shoulder pain.  He is status post right total shoulder arthroplasty that we performed approximately a year ago. He has done very well with the right shoulder and now brought to the operating room for planned left total shoulder arthroplasty as described below.  Preoperatively, I counseled Mr. Douglas Rasmussen regarding treatment options and potential risks versus benefits thereof.  Possible surgical complications were all reviewed including potential for bleeding, infection, neurovascular injury, persistent pain, loss of motion, anesthetic complication, failure of the implant, and possible need for additional surgery.  He understands and accepts and agrees with our planned procedure.  PROCEDURE IN DETAIL:  After undergoing routine preop evaluation, the patient received prophylactic antibiotics.  An interscalene block was established in the holding area by the Anesthesia Department.  Placed supine on the operative table, underwent smooth induction of a general endotracheal anesthesia.  Placed in a beach-chair position and appropriately padded  and protected.  Left shoulder girdle region was sterilely prepped and draped in standard fashion.  Time-out was called. An anterior deltopectoral approach was made to the left shoulder approximately 10 cm incision.  Skin flaps were elevated.  Dissection carried deeply.  Cephalic vein taken laterally with the deltoid. Deltopectoral interval was then developed from proximal to distal. Upper centimeter and a half of the pectoralis major tendon was tenotomized to enhance exposure.  The conjoint tendon was mobilized and retracted medially.  The biceps tendon was then tenodesed at the upper border of the pectoralis major and the tendon was then tenotomized and the proximal segment was unroofed and excised.  We then divided the subscapularis away from the lesser tuberosity, leaving a 1 cm cuff of tissue for later repair, and a subscapularis tendon was then tagged with a series of grasping #2 FiberWire sutures.  We then divided the capsular attachments from the anterior and inferior margins of the humeral neck, allowing delivery of the humeral head through the wound.  We then performed a humeral head resection at approximately 30 degrees retroversion, matching the native retroversion, carefully protecting the rotator cuff, and the humeral head was then excised.  We then used a rongeur to remove the osteophytes from the margin of the humeral neck. We then prepared the humeral canal, hand reaming,  and then broaching up to size 10 with excellent fit.  A size 9 stem with metal cap placed over the proximal humeral surface.  We then turned our attention to the glenoid, where we performed a circumferential labral resection, gaining complete visualization of the periphery of the glenoid.  The subscapularis was also completely mobilized.  A guide pin then placed into the center of the glenoid.  Using our large guide and then the glenoid was reamed with the large reamer to stable subchondral bony bed. All  residual bony debris was then removed.  We placed our central drill hole followed by the superior and inferior peg and slot respectively and the trial glenoid showed good fit after appropriate punching in preparation of the glenoid.  The glenoid was meticulously cleaned and dried.  Cement was mixed, introduced into superior and inferior peg and slot respectively.  We then impacted the glenoid with excellent fit and fixation.  At this point, we then returned our attention to the proximal humerus, where the canal was irrigated and cleaned.  We then impacted our size 10 stem to the appropriate level with an excellent interference fit and the proximal locking screws were then appropriately tightened. At this time, trial reductions were performed and ultimately, felt that the 50 x 19 eccentric head gave [place] the best soft tissue balance with approximately 50% translation of the humeral head and glenoid.  The trial was removed.  The Morse taper was cleaned and dried.  The final head was then impacted.  Final reduction was then performed.  Again, very pleased with the overall soft tissue balance.  At this point, the subscapularis was then repaired back to the lesser tuberosity using #2 FiberWires in a horizontal mattress pattern and then repaired the rotator interval with a pair of figure-of-eight #2 FiberWire sutures as well.  All suture limbs were then clipped.  The wound was copiously irrigated.  Final hemostasis was obtained.  The deltopectoral interval was then reapproximated with a series of figure-of-eight #1 Vicryl sutures.  2-0 Vicryl was used for the subcu layer, intracuticular 3-0 Monocryl for the skin, followed by Dermabond and Aquacel dressing.  Left arm was placed into a sling.  The patient was awakened, extubated, and taken to the recovery room in stable condition.  Jenetta Loges, PA-C, was used as an Environmental consultant throughout this case, was essential for help with positioning the  patient, positioning the extremity, tissue manipulation, suture management, implantation of prosthesis, wound closure, and intraoperative decision making.     Metta Clines. Brad Lieurance, M.D.     KMS/MEDQ  D:  02/01/2017  T:  02/02/2017  Job:  [id number]

## 2017-02-04 ENCOUNTER — Encounter (HOSPITAL_COMMUNITY): Payer: Self-pay | Admitting: Orthopedic Surgery

## 2017-02-14 DIAGNOSIS — Z96612 Presence of left artificial shoulder joint: Secondary | ICD-10-CM | POA: Diagnosis not present

## 2017-02-14 DIAGNOSIS — Z471 Aftercare following joint replacement surgery: Secondary | ICD-10-CM | POA: Diagnosis not present

## 2017-03-07 DIAGNOSIS — M6281 Muscle weakness (generalized): Secondary | ICD-10-CM | POA: Diagnosis not present

## 2017-03-07 DIAGNOSIS — M25612 Stiffness of left shoulder, not elsewhere classified: Secondary | ICD-10-CM | POA: Diagnosis not present

## 2017-03-07 DIAGNOSIS — Z96612 Presence of left artificial shoulder joint: Secondary | ICD-10-CM | POA: Diagnosis not present

## 2017-03-07 DIAGNOSIS — Z471 Aftercare following joint replacement surgery: Secondary | ICD-10-CM | POA: Diagnosis not present

## 2017-03-09 DIAGNOSIS — M25612 Stiffness of left shoulder, not elsewhere classified: Secondary | ICD-10-CM | POA: Diagnosis not present

## 2017-03-09 DIAGNOSIS — M6281 Muscle weakness (generalized): Secondary | ICD-10-CM | POA: Diagnosis not present

## 2017-03-09 DIAGNOSIS — Z96612 Presence of left artificial shoulder joint: Secondary | ICD-10-CM | POA: Diagnosis not present

## 2017-03-09 DIAGNOSIS — Z471 Aftercare following joint replacement surgery: Secondary | ICD-10-CM | POA: Diagnosis not present

## 2017-03-13 DIAGNOSIS — Z96612 Presence of left artificial shoulder joint: Secondary | ICD-10-CM | POA: Diagnosis not present

## 2017-03-13 DIAGNOSIS — M25612 Stiffness of left shoulder, not elsewhere classified: Secondary | ICD-10-CM | POA: Diagnosis not present

## 2017-03-13 DIAGNOSIS — M6281 Muscle weakness (generalized): Secondary | ICD-10-CM | POA: Diagnosis not present

## 2017-03-13 DIAGNOSIS — Z471 Aftercare following joint replacement surgery: Secondary | ICD-10-CM | POA: Diagnosis not present

## 2017-03-15 DIAGNOSIS — M6281 Muscle weakness (generalized): Secondary | ICD-10-CM | POA: Diagnosis not present

## 2017-03-15 DIAGNOSIS — M25612 Stiffness of left shoulder, not elsewhere classified: Secondary | ICD-10-CM | POA: Diagnosis not present

## 2017-03-15 DIAGNOSIS — Z471 Aftercare following joint replacement surgery: Secondary | ICD-10-CM | POA: Diagnosis not present

## 2017-03-15 DIAGNOSIS — Z96612 Presence of left artificial shoulder joint: Secondary | ICD-10-CM | POA: Diagnosis not present

## 2017-03-20 DIAGNOSIS — M25612 Stiffness of left shoulder, not elsewhere classified: Secondary | ICD-10-CM | POA: Diagnosis not present

## 2017-03-20 DIAGNOSIS — Z471 Aftercare following joint replacement surgery: Secondary | ICD-10-CM | POA: Diagnosis not present

## 2017-03-20 DIAGNOSIS — M6281 Muscle weakness (generalized): Secondary | ICD-10-CM | POA: Diagnosis not present

## 2017-03-20 DIAGNOSIS — Z96612 Presence of left artificial shoulder joint: Secondary | ICD-10-CM | POA: Diagnosis not present

## 2017-03-22 DIAGNOSIS — Z471 Aftercare following joint replacement surgery: Secondary | ICD-10-CM | POA: Diagnosis not present

## 2017-03-22 DIAGNOSIS — M25612 Stiffness of left shoulder, not elsewhere classified: Secondary | ICD-10-CM | POA: Diagnosis not present

## 2017-03-22 DIAGNOSIS — Z96612 Presence of left artificial shoulder joint: Secondary | ICD-10-CM | POA: Diagnosis not present

## 2017-03-22 DIAGNOSIS — M6281 Muscle weakness (generalized): Secondary | ICD-10-CM | POA: Diagnosis not present

## 2017-03-27 DIAGNOSIS — M6281 Muscle weakness (generalized): Secondary | ICD-10-CM | POA: Diagnosis not present

## 2017-03-27 DIAGNOSIS — Z471 Aftercare following joint replacement surgery: Secondary | ICD-10-CM | POA: Diagnosis not present

## 2017-03-27 DIAGNOSIS — M25612 Stiffness of left shoulder, not elsewhere classified: Secondary | ICD-10-CM | POA: Diagnosis not present

## 2017-03-27 DIAGNOSIS — Z96612 Presence of left artificial shoulder joint: Secondary | ICD-10-CM | POA: Diagnosis not present

## 2017-03-29 DIAGNOSIS — Z96612 Presence of left artificial shoulder joint: Secondary | ICD-10-CM | POA: Diagnosis not present

## 2017-03-29 DIAGNOSIS — Z471 Aftercare following joint replacement surgery: Secondary | ICD-10-CM | POA: Diagnosis not present

## 2017-03-29 DIAGNOSIS — M6281 Muscle weakness (generalized): Secondary | ICD-10-CM | POA: Diagnosis not present

## 2017-03-29 DIAGNOSIS — M25612 Stiffness of left shoulder, not elsewhere classified: Secondary | ICD-10-CM | POA: Diagnosis not present

## 2017-04-03 DIAGNOSIS — Z96612 Presence of left artificial shoulder joint: Secondary | ICD-10-CM | POA: Diagnosis not present

## 2017-04-03 DIAGNOSIS — M25612 Stiffness of left shoulder, not elsewhere classified: Secondary | ICD-10-CM | POA: Diagnosis not present

## 2017-04-03 DIAGNOSIS — Z471 Aftercare following joint replacement surgery: Secondary | ICD-10-CM | POA: Diagnosis not present

## 2017-04-03 DIAGNOSIS — M6281 Muscle weakness (generalized): Secondary | ICD-10-CM | POA: Diagnosis not present

## 2017-04-05 DIAGNOSIS — Z471 Aftercare following joint replacement surgery: Secondary | ICD-10-CM | POA: Diagnosis not present

## 2017-04-05 DIAGNOSIS — Z96612 Presence of left artificial shoulder joint: Secondary | ICD-10-CM | POA: Diagnosis not present

## 2017-04-05 DIAGNOSIS — M25612 Stiffness of left shoulder, not elsewhere classified: Secondary | ICD-10-CM | POA: Diagnosis not present

## 2017-04-05 DIAGNOSIS — M6281 Muscle weakness (generalized): Secondary | ICD-10-CM | POA: Diagnosis not present

## 2017-04-10 DIAGNOSIS — Z96612 Presence of left artificial shoulder joint: Secondary | ICD-10-CM | POA: Diagnosis not present

## 2017-04-10 DIAGNOSIS — M6281 Muscle weakness (generalized): Secondary | ICD-10-CM | POA: Diagnosis not present

## 2017-04-10 DIAGNOSIS — M25612 Stiffness of left shoulder, not elsewhere classified: Secondary | ICD-10-CM | POA: Diagnosis not present

## 2017-04-10 DIAGNOSIS — Z471 Aftercare following joint replacement surgery: Secondary | ICD-10-CM | POA: Diagnosis not present

## 2017-04-12 DIAGNOSIS — M25612 Stiffness of left shoulder, not elsewhere classified: Secondary | ICD-10-CM | POA: Diagnosis not present

## 2017-04-12 DIAGNOSIS — Z96612 Presence of left artificial shoulder joint: Secondary | ICD-10-CM | POA: Diagnosis not present

## 2017-04-12 DIAGNOSIS — M6281 Muscle weakness (generalized): Secondary | ICD-10-CM | POA: Diagnosis not present

## 2017-04-12 DIAGNOSIS — Z471 Aftercare following joint replacement surgery: Secondary | ICD-10-CM | POA: Diagnosis not present

## 2017-04-17 DIAGNOSIS — M25612 Stiffness of left shoulder, not elsewhere classified: Secondary | ICD-10-CM | POA: Diagnosis not present

## 2017-04-17 DIAGNOSIS — Z96612 Presence of left artificial shoulder joint: Secondary | ICD-10-CM | POA: Diagnosis not present

## 2017-04-17 DIAGNOSIS — M6281 Muscle weakness (generalized): Secondary | ICD-10-CM | POA: Diagnosis not present

## 2017-04-17 DIAGNOSIS — Z471 Aftercare following joint replacement surgery: Secondary | ICD-10-CM | POA: Diagnosis not present

## 2017-04-19 DIAGNOSIS — M25612 Stiffness of left shoulder, not elsewhere classified: Secondary | ICD-10-CM | POA: Diagnosis not present

## 2017-04-19 DIAGNOSIS — Z471 Aftercare following joint replacement surgery: Secondary | ICD-10-CM | POA: Diagnosis not present

## 2017-04-19 DIAGNOSIS — Z96612 Presence of left artificial shoulder joint: Secondary | ICD-10-CM | POA: Diagnosis not present

## 2017-04-19 DIAGNOSIS — M6281 Muscle weakness (generalized): Secondary | ICD-10-CM | POA: Diagnosis not present

## 2017-04-24 DIAGNOSIS — Z96612 Presence of left artificial shoulder joint: Secondary | ICD-10-CM | POA: Diagnosis not present

## 2017-04-24 DIAGNOSIS — Z471 Aftercare following joint replacement surgery: Secondary | ICD-10-CM | POA: Diagnosis not present

## 2017-04-24 DIAGNOSIS — M6281 Muscle weakness (generalized): Secondary | ICD-10-CM | POA: Diagnosis not present

## 2017-04-24 DIAGNOSIS — M25612 Stiffness of left shoulder, not elsewhere classified: Secondary | ICD-10-CM | POA: Diagnosis not present

## 2017-04-26 DIAGNOSIS — Z471 Aftercare following joint replacement surgery: Secondary | ICD-10-CM | POA: Diagnosis not present

## 2017-04-26 DIAGNOSIS — M6281 Muscle weakness (generalized): Secondary | ICD-10-CM | POA: Diagnosis not present

## 2017-04-26 DIAGNOSIS — Z96612 Presence of left artificial shoulder joint: Secondary | ICD-10-CM | POA: Diagnosis not present

## 2017-04-26 DIAGNOSIS — M25612 Stiffness of left shoulder, not elsewhere classified: Secondary | ICD-10-CM | POA: Diagnosis not present

## 2017-04-30 DIAGNOSIS — Z471 Aftercare following joint replacement surgery: Secondary | ICD-10-CM | POA: Diagnosis not present

## 2017-04-30 DIAGNOSIS — Z96619 Presence of unspecified artificial shoulder joint: Secondary | ICD-10-CM | POA: Diagnosis not present

## 2017-04-30 DIAGNOSIS — Z96612 Presence of left artificial shoulder joint: Secondary | ICD-10-CM | POA: Diagnosis not present

## 2017-07-16 DIAGNOSIS — Z86010 Personal history of colonic polyps: Secondary | ICD-10-CM | POA: Insufficient documentation

## 2017-08-09 DIAGNOSIS — Z86010 Personal history of colonic polyps: Secondary | ICD-10-CM | POA: Diagnosis not present

## 2017-08-09 DIAGNOSIS — Z87891 Personal history of nicotine dependence: Secondary | ICD-10-CM | POA: Diagnosis not present

## 2017-08-09 DIAGNOSIS — Z1211 Encounter for screening for malignant neoplasm of colon: Secondary | ICD-10-CM | POA: Diagnosis not present

## 2017-08-09 DIAGNOSIS — K641 Second degree hemorrhoids: Secondary | ICD-10-CM | POA: Diagnosis not present

## 2017-08-09 DIAGNOSIS — D120 Benign neoplasm of cecum: Secondary | ICD-10-CM | POA: Diagnosis not present

## 2017-08-09 DIAGNOSIS — I10 Essential (primary) hypertension: Secondary | ICD-10-CM | POA: Diagnosis not present

## 2017-08-27 DIAGNOSIS — Z6829 Body mass index (BMI) 29.0-29.9, adult: Secondary | ICD-10-CM | POA: Diagnosis not present

## 2017-08-27 DIAGNOSIS — Z86010 Personal history of colonic polyps: Secondary | ICD-10-CM | POA: Diagnosis not present

## 2017-12-19 DIAGNOSIS — E782 Mixed hyperlipidemia: Secondary | ICD-10-CM | POA: Diagnosis not present

## 2017-12-19 DIAGNOSIS — R7301 Impaired fasting glucose: Secondary | ICD-10-CM | POA: Diagnosis not present

## 2017-12-19 DIAGNOSIS — K760 Fatty (change of) liver, not elsewhere classified: Secondary | ICD-10-CM | POA: Diagnosis not present

## 2017-12-19 DIAGNOSIS — Z23 Encounter for immunization: Secondary | ICD-10-CM | POA: Diagnosis not present

## 2017-12-19 DIAGNOSIS — F17220 Nicotine dependence, chewing tobacco, uncomplicated: Secondary | ICD-10-CM | POA: Diagnosis not present

## 2017-12-19 DIAGNOSIS — I10 Essential (primary) hypertension: Secondary | ICD-10-CM | POA: Diagnosis not present

## 2017-12-19 DIAGNOSIS — D126 Benign neoplasm of colon, unspecified: Secondary | ICD-10-CM | POA: Diagnosis not present

## 2017-12-19 DIAGNOSIS — Z6831 Body mass index (BMI) 31.0-31.9, adult: Secondary | ICD-10-CM | POA: Diagnosis not present

## 2018-01-23 DIAGNOSIS — Z96612 Presence of left artificial shoulder joint: Secondary | ICD-10-CM | POA: Diagnosis not present

## 2018-01-23 DIAGNOSIS — Z471 Aftercare following joint replacement surgery: Secondary | ICD-10-CM | POA: Diagnosis not present

## 2018-01-23 DIAGNOSIS — Z96611 Presence of right artificial shoulder joint: Secondary | ICD-10-CM | POA: Diagnosis not present

## 2018-02-25 DIAGNOSIS — Z6832 Body mass index (BMI) 32.0-32.9, adult: Secondary | ICD-10-CM | POA: Diagnosis not present

## 2018-02-25 DIAGNOSIS — J029 Acute pharyngitis, unspecified: Secondary | ICD-10-CM | POA: Diagnosis not present

## 2018-02-25 DIAGNOSIS — R05 Cough: Secondary | ICD-10-CM | POA: Diagnosis not present

## 2018-03-05 DIAGNOSIS — Z6832 Body mass index (BMI) 32.0-32.9, adult: Secondary | ICD-10-CM | POA: Diagnosis not present

## 2018-03-05 DIAGNOSIS — J019 Acute sinusitis, unspecified: Secondary | ICD-10-CM | POA: Diagnosis not present

## 2018-03-05 DIAGNOSIS — I10 Essential (primary) hypertension: Secondary | ICD-10-CM | POA: Diagnosis not present

## 2018-03-05 DIAGNOSIS — F17220 Nicotine dependence, chewing tobacco, uncomplicated: Secondary | ICD-10-CM | POA: Diagnosis not present

## 2018-07-20 ENCOUNTER — Encounter (HOSPITAL_COMMUNITY): Payer: Self-pay

## 2018-07-20 ENCOUNTER — Emergency Department (HOSPITAL_COMMUNITY): Payer: Medicare Other

## 2018-07-20 ENCOUNTER — Other Ambulatory Visit: Payer: Self-pay

## 2018-07-20 ENCOUNTER — Emergency Department (HOSPITAL_COMMUNITY)
Admission: EM | Admit: 2018-07-20 | Discharge: 2018-07-20 | Disposition: A | Discharge status: Home / Self Care | Payer: Medicare Other | Attending: Emergency Medicine | Admitting: Emergency Medicine

## 2018-07-20 DIAGNOSIS — R319 Hematuria, unspecified: Secondary | ICD-10-CM | POA: Insufficient documentation

## 2018-07-20 DIAGNOSIS — F17220 Nicotine dependence, chewing tobacco, uncomplicated: Secondary | ICD-10-CM | POA: Diagnosis not present

## 2018-07-20 DIAGNOSIS — I10 Essential (primary) hypertension: Secondary | ICD-10-CM | POA: Insufficient documentation

## 2018-07-20 DIAGNOSIS — D3502 Benign neoplasm of left adrenal gland: Secondary | ICD-10-CM | POA: Diagnosis not present

## 2018-07-20 DIAGNOSIS — N281 Cyst of kidney, acquired: Secondary | ICD-10-CM | POA: Diagnosis not present

## 2018-07-20 DIAGNOSIS — N200 Calculus of kidney: Secondary | ICD-10-CM | POA: Diagnosis not present

## 2018-07-20 DIAGNOSIS — Z79899 Other long term (current) drug therapy: Secondary | ICD-10-CM | POA: Insufficient documentation

## 2018-07-20 LAB — CBC WITH DIFFERENTIAL/PLATELET
Abs Immature Granulocytes: 0.01 10*3/uL (ref 0.00–0.07)
Basophils Absolute: 0 10*3/uL (ref 0.0–0.1)
Basophils Relative: 1 %
Eosinophils Absolute: 0.1 10*3/uL (ref 0.0–0.5)
Eosinophils Relative: 2 %
HCT: 43.9 % (ref 39.0–52.0)
Hemoglobin: 13.8 g/dL (ref 13.0–17.0)
Immature Granulocytes: 0 %
Lymphocytes Relative: 24 %
Lymphs Abs: 1.1 10*3/uL (ref 0.7–4.0)
MCH: 30.1 pg (ref 26.0–34.0)
MCHC: 31.4 g/dL (ref 30.0–36.0)
MCV: 95.9 fL (ref 80.0–100.0)
Monocytes Absolute: 0.5 10*3/uL (ref 0.1–1.0)
Monocytes Relative: 12 %
Neutro Abs: 2.6 10*3/uL (ref 1.7–7.7)
Neutrophils Relative %: 61 %
Platelets: 161 10*3/uL (ref 150–400)
RBC: 4.58 MIL/uL (ref 4.22–5.81)
RDW: 14.3 % (ref 11.5–15.5)
WBC: 4.4 10*3/uL (ref 4.0–10.5)
nRBC: 0 % (ref 0.0–0.2)

## 2018-07-20 LAB — URINALYSIS, ROUTINE W REFLEX MICROSCOPIC
Bacteria, UA: NONE SEEN
Bilirubin Urine: NEGATIVE
Glucose, UA: NEGATIVE mg/dL
Ketones, ur: NEGATIVE mg/dL
Leukocytes,Ua: NEGATIVE
Nitrite: NEGATIVE
Protein, ur: NEGATIVE mg/dL
RBC / HPF: >50 RBC/hpf — ABNORMAL HIGH (ref 0–5)
Specific Gravity, Urine: 1.013 (ref 1.005–1.030)
pH: 7 (ref 5.0–8.0)

## 2018-07-20 LAB — BASIC METABOLIC PANEL
Anion gap: 8 (ref 5–15)
BUN: 15 mg/dL (ref 8–23)
CO2: 26 mmol/L (ref 22–32)
Calcium: 9 mg/dL (ref 8.9–10.3)
Chloride: 106 mmol/L (ref 98–111)
Creatinine, Ser: 0.46 mg/dL — ABNORMAL LOW (ref 0.61–1.24)
GFR calc Af Amer: >60 mL/min (ref >60)
GFR calc non Af Amer: >60 mL/min (ref >60)
Glucose, Bld: 131 mg/dL — ABNORMAL HIGH (ref 70–99)
Potassium: 4.1 mmol/L (ref 3.5–5.1)
Sodium: 140 mmol/L (ref 135–145)

## 2018-07-20 MED ORDER — IOHEXOL 300 MG/ML  SOLN
100.0000 mL | Freq: Once | INTRAMUSCULAR | Status: AC | PRN
  Administered 2018-07-20: 100 mL via INTRAVENOUS

## 2018-07-20 NOTE — Discharge Instructions (Addendum)
All the urologist on Monday to schedule a follow-up appointment continued evaluation of the blood in your urine.  Turn to the ED immediately for new or worsening symptoms or concerns such as abdominal pain, fevers, burning pain, vomiting or any concerns at all.

## 2018-07-20 NOTE — ED Provider Notes (Signed)
[facility] EMERGENCY DEPARTMENT Provider Note   CSN: [id number] Arrival date & time: 07/20/18  0854    History   Chief Complaint Chief Complaint  Patient presents with  . Hematuria    HPI Douglas Rasmussen is a 73 y.o. male.     HPI  73 year old male, with a PMH of HTN, kidney stones, presents with hematuria x1 day.  Patient states symptoms started yesterday after riding on the lawnmower for several hours.  He notes several blood clots when he urinates.  He states yesterday he felt lightheaded but is feeling better today.  He notes some associated nausea.  He denies any associated dysuria, urinary urgency, urinary frequency, penile discharge, testicular swelling or pain.  He denies any fevers, chills, chest pain, shortness of breath, abdominal pain.  States that typically with his kidney stones he has significant pain associated with his hematuria.  He states he has not followed with a urologist for an unknown amount of time.  She denies being on any blood thinners.  Past Medical History:  Diagnosis Date  . Arthritis    "qwhere" (01/13/2016)  . Fatty liver   . H/O cardiovascular stress test 12/2015  . History of kidney stones    last episode 2-3 yrs. ago, but also remarks that he has had cystocopy & lithotripsy   . Hypertension   . Pre-diabetes     Patient Active Problem List   Diagnosis Date Noted  . 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 CARPAL TUNNEL RELEASE Bilateral 2003-2004   left-right  . CATARACT EXTRACTION W/PHACO Right 10/29/2014   Procedure: CATARACT EXTRACTION PHACO AND INTRAOCULAR LENS PLACEMENT RIGHT EYE CDE=6.86;  Surgeon: Tonny Branch, MD;  Location: [facility];  Service: Ophthalmology;  Laterality: Right;  . CATARACT EXTRACTION W/PHACO Left 11/26/2014   Procedure: CATARACT EXTRACTION PHACO AND INTRAOCULAR LENS PLACEMENT (IOC);  Surgeon: Tonny Branch,  MD;  Location: [facility];  Service: Ophthalmology;  Laterality: Left;  CDE:5.33  . COLONOSCOPY    . CYSTOSCOPY/RETROGRADE/URETEROSCOPY/STONE EXTRACTION WITH BASKET  X 2  . JOINT REPLACEMENT    . [place]  . LITHOTRIPSY    . TONSILLECTOMY  1958  . TOTAL HIP ARTHROPLASTY Left 2012  . TOTAL SHOULDER ARTHROPLASTY Right 01/13/2016  . TOTAL SHOULDER ARTHROPLASTY Right 01/13/2016   Procedure: RIGHT TOTAL SHOULDER ARTHROPLASTY;  Surgeon: Justice Britain, MD;  Location: [place];  Service: Orthopedics;  Laterality: Right;  . TOTAL SHOULDER ARTHROPLASTY Left 02/01/2017   Procedure: TOTAL SHOULDER ARTHROPLASTY;  Surgeon: Justice Britain, MD;  Location: [place];  Service: Orthopedics;  Laterality: Left;        Home Medications    Prior to Admission medications   Medication Sig Start Date End Date Taking? Authorizing Provider  Aspirin-Salicylamide-Caffeine (BC HEADACHE POWDER PO) Take 1 packet by mouth daily.    [provider]  CINNAMON PO Take 1 capsule by mouth daily.    [provider]  Coenzyme Q10 (CO Q 10 PO) Take 1 capsule by mouth daily.     [provider]  diazepam (VALIUM) 5 MG tablet Take 0.5-1 tablets (2.5-5 mg total) by mouth every 6 (six) hours as needed for muscle spasms or sedation. 02/02/17   Shuford, Olivia Mackie, PA-C  lisinopril (PRINIVIL,ZESTRIL) 5 MG tablet Take 5 mg by mouth daily.    [provider]  Multiple Vitamins-Minerals (CENTRUM SILVER PO) Take 1 tablet  by mouth every other day.     [provider]  naproxen (NAPROSYN) 500 MG tablet Take 1 tablet (500 mg total) by mouth 2 (two) times daily with a meal. 02/02/17   Shuford, Olivia Mackie, PA-C  Omega-3 Fatty Acids (FISH OIL PO) Take 1 capsule by mouth daily.    [provider]  ondansetron (ZOFRAN) 4 MG tablet Take 1 tablet (4 mg total) by mouth every 8 (eight) hours as needed for nausea or vomiting. Patient not taking: Reported on 01/23/2017 01/14/16   Shuford, Olivia Mackie, PA-C   ondansetron (ZOFRAN) 4 MG tablet Take 1 tablet (4 mg total) by mouth every 8 (eight) hours as needed for nausea or vomiting. 02/02/17   Shuford, Olivia Mackie, PA-C  oxyCODONE-acetaminophen (PERCOCET) 5-325 MG tablet Take 1-2 tablets by mouth every 4 (four) hours as needed. 02/02/17   Shuford, Olivia Mackie, PA-C  Polyethyl Glycol-Propyl Glycol (SYSTANE OP) Place 1 drop into both eyes daily.    [provider]    Family History Family History  Problem Relation Age of Onset  . Diabetes Mother   . Heart disease Mother   . Arthritis Father   . Alcoholism Father     Social History Social History   Tobacco Use  . Smoking status: Never Smoker  . Smokeless tobacco: Current User    Types: Snuff  Substance Use Topics  . Alcohol use: Yes    Alcohol/week: 56.0 standard drinks    Types: 56 Cans of beer per week    Comment: 01/13/2016 average 8/day"  . Drug use: No     Allergies   Patient has no known allergies.   Review of Systems Review of Systems  Constitutional: Negative for chills and fever.  HENT: Negative for rhinorrhea and sore throat.   Respiratory: Negative for cough and shortness of breath.   Cardiovascular: Negative for chest pain and leg swelling.  Gastrointestinal: Positive for nausea. Negative for abdominal pain, diarrhea and vomiting.  Genitourinary: Positive for hematuria. Negative for dysuria, frequency and urgency.  Skin: Negative for rash and wound.  Neurological: Positive for light-headedness.  All other systems reviewed and are negative.    Physical Exam Updated Vital Signs BP (!) 153/86 (BP Location: Left Arm)   Pulse 79   Temp 97.6 F (36.4 C)   Resp 12   Ht 5\' 8"  (1.727 m)   Wt 95.3 kg   SpO2 95%   BMI 31.93 kg/m   Physical Exam Vitals signs and nursing note reviewed.  Constitutional:      Appearance: He is well-developed.  HENT:     Head: Normocephalic and atraumatic.  Eyes:     Conjunctiva/sclera: Conjunctivae normal.  Neck:      Musculoskeletal: Neck supple.  Cardiovascular:     Rate and Rhythm: Normal rate and regular rhythm.     Heart sounds: Normal heart sounds. No murmur.  Pulmonary:     Effort: Pulmonary effort is normal. No respiratory distress.     Breath sounds: Normal breath sounds. No wheezing or rales.  Abdominal:     General: Bowel sounds are normal. There is no distension.     Palpations: Abdomen is soft.     Tenderness: There is no abdominal tenderness.  Musculoskeletal: Normal range of motion.        General: No tenderness or deformity.  Skin:    General: Skin is warm and dry.     Findings: No erythema or rash.  Neurological:     Mental Status: He is alert  and oriented to person, place, and time.  Psychiatric:        Behavior: Behavior normal.      ED Treatments / Results  Labs (all labs ordered are listed, but only abnormal results are displayed) Labs Reviewed  URINE CULTURE  URINALYSIS, ROUTINE W REFLEX MICROSCOPIC  CBC WITH DIFFERENTIAL/PLATELET  BASIC METABOLIC PANEL    EKG None  Radiology No results found.  Procedures Procedures (including critical care time)  Medications Ordered in ED Medications - No data to display   Initial Impression / Assessment and Plan / ED Course  I have reviewed the triage vital signs and the nursing notes.  Pertinent labs & imaging results that were available during my care of the patient were reviewed by me and considered in my medical decision making (see chart for details).        Patient presents with painless hematuria of unknown etiology.  Patient states he has been passing clots.  His abdomen is soft and nontender to palpation.  His vital signs have been stable in the ED.  His blood work shows a globin within normal range.  His kidney function is good.  His urine shows red blood cells but no evidence of a UTI, no leukocyte esterase, no nitrites, no bacteria.  He had a CT scan in the ED which shows benign renal cyst.  Called and  spoke with urology, Dr. Gloriann Loan.  He is agreeable to following up with the patient outpatient.  He recommended the patient calling on Monday for further evaluation.  Given strict return precautions.  He is ready and stable for discharge.   At this time there does not appear to be any evidence of an acute emergency medical condition and the patient appears stable for discharge with appropriate outpatient follow up.Diagnosis was discussed with patient who verbalizes understanding and is agreeable to discharge. Pt case discussed with Dr. Lacinda Axon who agrees with my plan.   Final Clinical Impressions(s) / ED Diagnoses   Final diagnoses:  None    ED Discharge Orders    None       Rachel Moulds 07/20/18 1703    Nat Christen, MD 07/21/18 [phone]

## 2018-07-20 NOTE — ED Triage Notes (Signed)
Pt started having blood in urine yesterday evening. Denies any pain with urination. When he had his physical done he was told his prostate was enlarged. NAD. History of kidney stones, but is not experiencing any pain.

## 2018-07-21 LAB — URINE CULTURE: Culture: NO GROWTH

## 2018-10-03 ENCOUNTER — Other Ambulatory Visit: Payer: Self-pay

## 2018-12-12 DIAGNOSIS — M25551 Pain in right hip: Secondary | ICD-10-CM | POA: Diagnosis not present

## 2018-12-12 DIAGNOSIS — M25561 Pain in right knee: Secondary | ICD-10-CM | POA: Diagnosis not present

## 2019-01-02 DIAGNOSIS — M545 Low back pain: Secondary | ICD-10-CM | POA: Diagnosis not present

## 2019-01-02 DIAGNOSIS — M25561 Pain in right knee: Secondary | ICD-10-CM | POA: Diagnosis not present

## 2019-01-02 DIAGNOSIS — M1711 Unilateral primary osteoarthritis, right knee: Secondary | ICD-10-CM | POA: Diagnosis not present

## 2019-01-03 ENCOUNTER — Other Ambulatory Visit: Payer: Self-pay | Admitting: Orthopedic Surgery

## 2019-01-03 DIAGNOSIS — M545 Low back pain: Secondary | ICD-10-CM

## 2019-01-03 DIAGNOSIS — G8929 Other chronic pain: Secondary | ICD-10-CM

## 2019-01-07 ENCOUNTER — Telehealth: Payer: Self-pay

## 2019-01-07 NOTE — Telephone Encounter (Signed)
Spoke with patient to review his medications before being scheduled for a myelogram.  He was informed he will be here about two hours, will need a driver, will need to be on strict bedrest for 24 hours after the myelo and will need to hold [facility] for three days before the procedure.

## 2019-01-14 ENCOUNTER — Ambulatory Visit
Admission: RE | Admit: 2019-01-14 | Discharge: 2019-01-14 | Disposition: A | Discharge status: Home / Self Care | Payer: Medicare Other | Source: Ambulatory Visit | Attending: Orthopedic Surgery | Admitting: Orthopedic Surgery

## 2019-01-14 ENCOUNTER — Other Ambulatory Visit: Payer: Self-pay

## 2019-01-14 DIAGNOSIS — G8929 Other chronic pain: Secondary | ICD-10-CM

## 2019-01-14 DIAGNOSIS — M48061 Spinal stenosis, lumbar region without neurogenic claudication: Secondary | ICD-10-CM | POA: Diagnosis not present

## 2019-01-14 DIAGNOSIS — M545 Low back pain, unspecified: Secondary | ICD-10-CM

## 2019-01-14 DIAGNOSIS — M5126 Other intervertebral disc displacement, lumbar region: Secondary | ICD-10-CM | POA: Diagnosis not present

## 2019-01-14 MED ORDER — DIAZEPAM 5 MG PO TABS
5.0000 mg | ORAL_TABLET | Freq: Once | ORAL | Status: AC
  Administered 2019-01-14: 10:00:00 5 mg via ORAL

## 2019-01-14 MED ORDER — IOPAMIDOL (ISOVUE-M 200) INJECTION 41%
15.0000 mL | Freq: Once | INTRAMUSCULAR | Status: AC
  Administered 2019-01-14: 11:00:00 15 mL via INTRATHECAL

## 2019-01-14 NOTE — Discharge Instructions (Signed)

## 2019-01-14 NOTE — Progress Notes (Signed)
Patient states he has been off [employer] for at least the past three days.

## 2019-01-22 DIAGNOSIS — M5416 Radiculopathy, lumbar region: Secondary | ICD-10-CM | POA: Diagnosis not present

## 2019-01-22 DIAGNOSIS — M545 Low back pain: Secondary | ICD-10-CM | POA: Diagnosis not present

## 2019-01-22 DIAGNOSIS — M4800 Spinal stenosis, site unspecified: Secondary | ICD-10-CM | POA: Diagnosis not present

## 2019-03-14 DIAGNOSIS — M5136 Other intervertebral disc degeneration, lumbar region: Secondary | ICD-10-CM | POA: Diagnosis not present

## 2019-03-17 DIAGNOSIS — M5136 Other intervertebral disc degeneration, lumbar region: Secondary | ICD-10-CM | POA: Diagnosis not present

## 2019-03-20 DIAGNOSIS — M5136 Other intervertebral disc degeneration, lumbar region: Secondary | ICD-10-CM | POA: Diagnosis not present

## 2019-03-24 DIAGNOSIS — M5136 Other intervertebral disc degeneration, lumbar region: Secondary | ICD-10-CM | POA: Diagnosis not present

## 2019-03-27 DIAGNOSIS — M5416 Radiculopathy, lumbar region: Secondary | ICD-10-CM | POA: Diagnosis not present

## 2019-03-31 DIAGNOSIS — M5136 Other intervertebral disc degeneration, lumbar region: Secondary | ICD-10-CM | POA: Diagnosis not present

## 2019-04-02 DIAGNOSIS — M5136 Other intervertebral disc degeneration, lumbar region: Secondary | ICD-10-CM | POA: Diagnosis not present

## 2019-04-07 DIAGNOSIS — M5136 Other intervertebral disc degeneration, lumbar region: Secondary | ICD-10-CM | POA: Diagnosis not present

## 2019-04-09 DIAGNOSIS — M5136 Other intervertebral disc degeneration, lumbar region: Secondary | ICD-10-CM | POA: Diagnosis not present

## 2019-04-11 DIAGNOSIS — M5416 Radiculopathy, lumbar region: Secondary | ICD-10-CM | POA: Diagnosis not present

## 2019-04-11 DIAGNOSIS — M5136 Other intervertebral disc degeneration, lumbar region: Secondary | ICD-10-CM | POA: Diagnosis not present

## 2019-04-11 DIAGNOSIS — M4800 Spinal stenosis, site unspecified: Secondary | ICD-10-CM | POA: Diagnosis not present

## 2019-04-15 ENCOUNTER — Encounter: Payer: Self-pay | Admitting: Urology

## 2019-04-15 ENCOUNTER — Ambulatory Visit (INDEPENDENT_AMBULATORY_CARE_PROVIDER_SITE_OTHER): Payer: Medicare Other | Admitting: Urology

## 2019-04-15 ENCOUNTER — Other Ambulatory Visit: Payer: Self-pay

## 2019-04-15 VITALS — BP 136/80 | HR 92 | Temp 97.0°F | Ht 67.5 in | Wt 220.0 lb

## 2019-04-15 DIAGNOSIS — R972 Elevated prostate specific antigen [PSA]: Secondary | ICD-10-CM

## 2019-04-15 DIAGNOSIS — N402 Nodular prostate without lower urinary tract symptoms: Secondary | ICD-10-CM | POA: Diagnosis not present

## 2019-04-15 LAB — POCT URINALYSIS DIPSTICK
Bilirubin, UA: NEGATIVE
Glucose, UA: NEGATIVE
Ketones, UA: NEGATIVE
Nitrite, UA: NEGATIVE
Protein, UA: NEGATIVE
Spec Grav, UA: 1.025 (ref 1.010–1.025)
Urobilinogen, UA: 0.2 E.U./dL
pH, UA: 5 (ref 5.0–8.0)

## 2019-04-15 MED ORDER — LEVOFLOXACIN 750 MG PO TABS: 750.0000 mg | ORAL_TABLET | Freq: Once | ORAL | 0 refills | Status: AC

## 2019-04-15 NOTE — Progress Notes (Signed)
H&P  Chief Complaint: Elevated PSA  History of Present Illness:   2.9.2021: This man presents today referred by his PCP for his elevated PSA. He reports a hx of renal calculi and cysts (last imaging showed 5 mm left renal stone and bilateral renal cysts) but denies any hx of prostate issues nor any knowledge of a family hx of PCa. Aside from occasional post-void dribbling, he denies any significant urological complaints in the last few years. He did by chance mention that he worked for [employer] but did not specify what work he specifically did (hard to assess risk of exposure to carcinogens).   His PSA levels have been as follows:   Date PSA ng/mL  July 2013 1.07 Jan 2015 2.05 Dec 2016   3.3  [???] 2019     ~4.4**  Dec 2020 7.5  **Self reported value, he could not recall exact date/value and did not have records for this year. All other data from his medical records.   IPSS Questionnaire (AUA-7): Over the past month.   1)  How often have you had a sensation of not emptying your bladder completely after you finish urinating?  3 - About half the time  2)  How often have you had to urinate again less than two hours after you finished urinating? 2 - Less than half the time  3)  How often have you found you stopped and started again several times when you urinated?  4 - More than half the time  4) How difficult have you found it to postpone urination?  1 - Less than 1 time in 5  5) How often have you had a weak urinary stream?  2 - Less than half the time  6) How often have you had to push or strain to begin urination?  2 - Less than half the time  7) How many times did you most typically get up to urinate from the time you went to bed until the time you got up in the morning?  1 - 1 time  Total score:  0-7 mildly symptomatic   8-19 moderately symptomatic   20-35 severely symptomatic  IPSS: 15 QoL: 2  Past Medical History:  Diagnosis Date  . Arthritis    "qwhere" (01/13/2016)    . Fatty liver   . H/O cardiovascular stress test 12/2015  . History of kidney stones    last episode 2-3 yrs. ago, but also remarks that he has had cystocopy & lithotripsy   . Hypertension   . Pre-diabetes     Past Surgical History:  Procedure Laterality Date  . CARPAL TUNNEL RELEASE Bilateral 2003-2004   left-right  . CATARACT EXTRACTION W/PHACO Right 10/29/2014   Procedure: CATARACT EXTRACTION PHACO AND INTRAOCULAR LENS PLACEMENT RIGHT EYE CDE=6.86;  Surgeon: Tonny Branch, MD;  Location: [facility];  Service: Ophthalmology;  Laterality: Right;  . CATARACT EXTRACTION W/PHACO Left 11/26/2014   Procedure: CATARACT EXTRACTION PHACO AND INTRAOCULAR LENS PLACEMENT (IOC);  Surgeon: Tonny Branch, MD;  Location: [facility];  Service: Ophthalmology;  Laterality: Left;  CDE:5.33  . COLONOSCOPY    . CYSTOSCOPY/RETROGRADE/URETEROSCOPY/STONE EXTRACTION WITH BASKET  X 2  . JOINT REPLACEMENT    . [place]  . LITHOTRIPSY    . TONSILLECTOMY  1958  . TOTAL HIP ARTHROPLASTY Left 2012  . TOTAL SHOULDER ARTHROPLASTY Right 01/13/2016  . TOTAL SHOULDER ARTHROPLASTY Right 01/13/2016   Procedure: RIGHT TOTAL SHOULDER ARTHROPLASTY;  Surgeon: Justice Britain, MD;  Location: [place];  Service: Orthopedics;  Laterality: Right;  . TOTAL SHOULDER ARTHROPLASTY Left 02/01/2017   Procedure: TOTAL SHOULDER ARTHROPLASTY;  Surgeon: Justice Britain, MD;  Location: [place];  Service: Orthopedics;  Laterality: Left;    Home Medications:  Allergies as of 04/15/2019   No Known Allergies     Medication List       Accurate as of April 15, 2019  2:29 PM. If you have any questions, ask your nurse or doctor.        BC HEADACHE POWDER PO Take 1 packet by mouth daily.   CENTRUM SILVER PO Take 1 tablet by mouth every other day.   Cinnamon 500 MG capsule Take 2 capsules by mouth daily.   CO Q 10 PO Take 1 capsule by mouth daily.   FISH OIL PO Take 1 capsule by mouth daily.   lisinopril 5 MG tablet Commonly known  as: ZESTRIL Take 5 mg by mouth daily.   metFORMIN 500 MG tablet Commonly known as: GLUCOPHAGE Take 500 mg by mouth 2 (two) times daily.   SYSTANE OP Place 1 drop into both eyes daily.       Allergies: No Known Allergies  Family History  Problem Relation Age of Onset  . Diabetes Mother   . Heart disease Mother   . Arthritis Mother   . Arthritis Father   . Alcoholism Father     Social History:  reports that he has never smoked. His smokeless tobacco use includes snuff. He reports current alcohol use of about 56.0 standard drinks of alcohol per week. He reports that he does not use drugs.  ROS: A complete review of systems was performed.  All systems are negative except for pertinent findings as noted.  Physical Exam:  Vital signs in last 24 hours: BP 136/80   Pulse 92   Temp (!) 97 F (36.1 C)   Ht 5' 7.5" (1.715 m)   Wt 220 lb (99.8 kg)   BMI 33.95 kg/m  Constitutional:  Alert and oriented, No acute distress Cardiovascular: Regular rate  Respiratory: Normal respiratory effort GI: Abdomen is soft, nontender, nondistended, no abdominal masses. No CVAT. No hernias. Genitourinary: Normal male phallus, testes are descended bilaterally and non-tender and without masses, scrotum is normal in appearance without lesions or masses, perineum is normal on inspection. Prostate feels around 30 g in size, 4 mm left lobe nodule and 4 mm midline nodule. Lymphatic: No lymphadenopathy Neurologic: Grossly intact, no focal deficits Psychiatric: Normal mood and affect  Laboratory Data:  No results for input(s): WBC, HGB, HCT, PLT in the last 72 hours.  No results for input(s): NA, K, CL, GLUCOSE, BUN, CALCIUM, CREATININE in the last 72 hours.  Invalid input(s): CO3   Results for orders placed or performed in visit on 04/15/19 (from the past 24 hour(s))  POCT urinalysis dipstick     Status: Abnormal   Collection Time: 04/15/19  2:14 PM  Result Value Ref Range   Color, UA yellow      Clarity, UA     Glucose, UA Negative Negative   Bilirubin, UA neg    Ketones, UA neg    Spec Grav, UA 1.025 1.010 - 1.025   Blood, UA +    pH, UA 5.0 5.0 - 8.0   Protein, UA Negative Negative   Urobilinogen, UA 0.2 0.2 or 1.0 E.U./dL   Nitrite, UA neg    Leukocytes, UA Trace (A) Negative   Appearance clear    Odor  No results found for this or any previous visit (from the past 240 hour(s)).  Renal Function: No results for input(s): CREATININE in the last 168 hours. CrCl cannot be calculated (Patient's most recent lab result is older than the maximum 21 days allowed.).  Radiologic Imaging: No results found.  Impression/Assessment:  His PSA has been elevating over the last few years with a marked increase over the last year. His prostate had 2 distinct nodules (both 4 mm, left lobe and midline) on exam which does raise my suspicion for possible cancer. It would be appropriate to move forward with bx.   Plan:  1. TRUSP/bx -- will call to schedule.   2. Discussed PSA as a screening tool and what his PSA trend could mean. We also discussed what next steps could possibly be for him w/ surveillance or possible treatment. Ultimately, given the nodules found on today's exam I do not think it would be neccessary to repeat a PSA because I would advise moving forward with a bx regardless of its value.   3. Information on TRUSP/bx as well as its possible risks and complications were discussed with patient. Periprocedural instructions were also given per nurse.

## 2019-04-15 NOTE — Patient Instructions (Addendum)
Prostate Biopsy Instructions  Stop all aspirin or blood thinners (aspirin, plavix, coumadin, warfarin, motrin, ibuprofen, advil, aleve, naproxen, naprosyn) for 7 days prior to the procedure.  If you have any questions about stopping these medications, please contact your primary care physician or cardiologist.  Having a light meal prior to the procedure is recommended.  If you are diabetic or have low blood sugar please bring a small snack or glucose tablet.  A Fleets enema is needed to be purchased over the counter at a local pharmacy and used 2 hours before you scheduled appointment.  This can be purchased over the counter at any pharmacy.  Antibiotics will be administered in the clinic at the time of the procedure unless otherwise specified.    Please bring someone with you to the procedure to drive you home.  A follow up appointment has been scheduled for you to receive the results of the biopsy.  If you have any questions or concerns, please feel free to call the office at [phone] or send a Mychart message.    Thank you, [facility] [place]

## 2019-04-15 NOTE — Progress Notes (Signed)

## 2019-04-16 DIAGNOSIS — M5136 Other intervertebral disc degeneration, lumbar region: Secondary | ICD-10-CM | POA: Diagnosis not present

## 2019-04-17 ENCOUNTER — Telehealth: Payer: Self-pay

## 2019-04-17 NOTE — Addendum Note (Signed)
Addended byIris Pert on: 04/17/2019 11:17 AM   Modules accepted: Orders

## 2019-04-17 NOTE — Telephone Encounter (Signed)
Pt. called and made aware of Biopsy date and time.

## 2019-04-23 ENCOUNTER — Ambulatory Visit: Payer: Medicare Other | Attending: Internal Medicine

## 2019-04-23 DIAGNOSIS — Z23 Encounter for immunization: Secondary | ICD-10-CM | POA: Insufficient documentation

## 2019-04-23 DIAGNOSIS — M5136 Other intervertebral disc degeneration, lumbar region: Secondary | ICD-10-CM | POA: Diagnosis not present

## 2019-04-23 NOTE — Progress Notes (Signed)
   Covid-19 Vaccination Clinic  Name:  Douglas Rasmussen    MRN: [id number] DOB: [birth year]  04/23/2019  Mr. Ellner was observed post Covid-19 immunization for 15 minutes without incidence. He was provided with Vaccine Information Sheet and instruction to access the V-Safe system.   Mr. Rei was instructed to call 911 with any severe reactions post vaccine: Marland Kitchen Difficulty breathing  . Swelling of your face and throat  . A fast heartbeat  . A bad rash all over your body  . Dizziness and weakness    Immunizations Administered    Name Date Dose VIS Date Route   Moderna COVID-19 Vaccine 04/23/2019 12:41 PM 0.5 mL 02/04/2019 Intramuscular   Manufacturer: Moderna   Lot: [id number]   [id number]

## 2019-04-28 DIAGNOSIS — M5136 Other intervertebral disc degeneration, lumbar region: Secondary | ICD-10-CM | POA: Diagnosis not present

## 2019-05-06 ENCOUNTER — Ambulatory Visit (HOSPITAL_COMMUNITY)
Admission: RE | Admit: 2019-05-06 | Discharge: 2019-05-06 | Disposition: A | Discharge status: Home / Self Care | Payer: Medicare Other | Source: Ambulatory Visit | Attending: Urology | Admitting: Urology

## 2019-05-06 ENCOUNTER — Other Ambulatory Visit: Payer: Self-pay

## 2019-05-06 ENCOUNTER — Other Ambulatory Visit: Payer: Self-pay | Admitting: Urology

## 2019-05-06 DIAGNOSIS — R972 Elevated prostate specific antigen [PSA]: Secondary | ICD-10-CM | POA: Diagnosis not present

## 2019-05-06 DIAGNOSIS — N402 Nodular prostate without lower urinary tract symptoms: Secondary | ICD-10-CM | POA: Diagnosis not present

## 2019-05-06 DIAGNOSIS — C61 Malignant neoplasm of prostate: Secondary | ICD-10-CM | POA: Diagnosis not present

## 2019-05-06 MED ORDER — GENTAMICIN SULFATE 40 MG/ML IJ SOLN
160.0000 mg | Freq: Once | INTRAMUSCULAR | Status: AC
  Administered 2019-05-06: 160 mg via INTRAMUSCULAR

## 2019-05-06 NOTE — Procedures (Signed)
This man presents for TRUS/Bx.  Reason for procedure: Prostate nodule/elevated PSA  Prostate exam: Left prostate nodule  Appropriate timeout was performed. The patient was placed in the left lateral decubitus position.  Transrectal ultrasound probe was passed without difficulty. Prostate was scanned in transverse and sagittal dimensions.  Volume was obtained. Ultrasound findings: Hypoechoic areas left prostate. Prostate volume 81.03 ml. Using a spinal needle, 10 mL of 2% plain lidocaine was utilized to infiltrate the lateral aspect of the seminal vesicles bilaterally. Following this, 12 cores were taken using the biopsy gun, and the following distribution: Right base lateral, right base medial, right mid lateral, right mid medial, right apex lateral, right apex medial, left base lateral, left base medial, left mid lateral, left mid medial, left apex lateral, left apex medial.  These were all sent for pathologic review separately in formalin.  At this point, the probe was removed.  Pressure was placed on the anal area for approximately 1 minute.  After establishing no significant bleeding, the procedure was then terminated.  The patient tolerated the procedure well. He was given post-boipsy instructions. We will call with pathology results and followup.

## 2019-05-06 NOTE — Progress Notes (Signed)
Prostate biopsy completed  Gentamicin 160mg  IM given prior to procedure. Patient tolerated well, Discharge instruction given. Patient to follow up with Dr Diona Fanti for results.

## 2019-05-08 ENCOUNTER — Telehealth: Payer: Self-pay

## 2019-05-08 ENCOUNTER — Other Ambulatory Visit: Payer: Self-pay | Admitting: Urology

## 2019-05-08 DIAGNOSIS — C61 Malignant neoplasm of prostate: Secondary | ICD-10-CM

## 2019-05-08 NOTE — Telephone Encounter (Signed)
-----   Message from Franchot Gallo, MD sent at 05/08/2019 12:30 PM EST ----- I called pt w/ results. Please have copies sent to him as well as PCP.  I also put orders for ct.bone scan. Needs PCa conference following these ----- Message ----- From: Dorisann Frames, RN Sent: 05/08/2019   8:39 AM EST To: Franchot Gallo, MD  Prostate biopsy for review

## 2019-05-08 NOTE — Telephone Encounter (Signed)
Pt notified. Appt/path report mailed to pt and pcp.

## 2019-05-21 ENCOUNTER — Ambulatory Visit: Payer: Medicare Other | Attending: Internal Medicine

## 2019-05-21 DIAGNOSIS — K760 Fatty (change of) liver, not elsewhere classified: Secondary | ICD-10-CM | POA: Diagnosis not present

## 2019-05-21 DIAGNOSIS — E1169 Type 2 diabetes mellitus with other specified complication: Secondary | ICD-10-CM | POA: Diagnosis not present

## 2019-05-21 DIAGNOSIS — E782 Mixed hyperlipidemia: Secondary | ICD-10-CM | POA: Diagnosis not present

## 2019-05-21 DIAGNOSIS — F17220 Nicotine dependence, chewing tobacco, uncomplicated: Secondary | ICD-10-CM | POA: Diagnosis not present

## 2019-05-21 DIAGNOSIS — Z23 Encounter for immunization: Secondary | ICD-10-CM

## 2019-05-21 DIAGNOSIS — I10 Essential (primary) hypertension: Secondary | ICD-10-CM | POA: Diagnosis not present

## 2019-05-21 NOTE — Progress Notes (Signed)
   Covid-19 Vaccination Clinic  Name:  Douglas Rasmussen    MRN: [id number] DOB: 1946-02-03  05/21/2019  Mr. Spengler was observed post Covid-19 immunization for 15 minutes without incident. He was provided with Vaccine Information Sheet and instruction to access the V-Safe system.   Mr. Rancourt was instructed to call 911 with any severe reactions post vaccine: Marland Kitchen Difficulty breathing  . Swelling of face and throat  . A fast heartbeat  . A bad rash all over body  . Dizziness and weakness   Immunizations Administered    Name Date Dose VIS Date Route   Moderna COVID-19 Vaccine 05/21/2019 12:47 PM 0.5 mL 02/04/2019 Intramuscular   Manufacturer: Moderna   Lot: [id number]   [place]:[id number]

## 2019-06-05 ENCOUNTER — Encounter (HOSPITAL_COMMUNITY): Payer: Self-pay

## 2019-06-05 ENCOUNTER — Ambulatory Visit (HOSPITAL_COMMUNITY)
Admission: RE | Admit: 2019-06-05 | Discharge: 2019-06-05 | Disposition: A | Discharge status: Home / Self Care | Payer: Medicare Other | Source: Ambulatory Visit | Attending: Urology | Admitting: Urology

## 2019-06-05 ENCOUNTER — Other Ambulatory Visit: Payer: Self-pay

## 2019-06-05 ENCOUNTER — Encounter (HOSPITAL_COMMUNITY)
Admission: RE | Admit: 2019-06-05 | Discharge: 2019-06-05 | Disposition: A | Discharge status: Home / Self Care | Payer: Medicare Other | Source: Ambulatory Visit | Attending: Urology | Admitting: Urology

## 2019-06-05 DIAGNOSIS — C61 Malignant neoplasm of prostate: Secondary | ICD-10-CM

## 2019-06-05 HISTORY — DX: Type 2 diabetes mellitus without complications: E11.9

## 2019-06-05 HISTORY — DX: Malignant (primary) neoplasm, unspecified: C80.1

## 2019-06-05 LAB — POCT I-STAT CREATININE: Creatinine, Ser: 0.6 mg/dL — ABNORMAL LOW (ref 0.61–1.24)

## 2019-06-05 MED ORDER — IOHEXOL 300 MG/ML  SOLN
100.0000 mL | Freq: Once | INTRAMUSCULAR | Status: AC | PRN
  Administered 2019-06-05: 100 mL via INTRAVENOUS

## 2019-06-05 MED ORDER — TECHNETIUM TC 99M MEDRONATE IV KIT
20.0000 | PACK | Freq: Once | INTRAVENOUS | Status: AC | PRN
  Administered 2019-06-05: 19.6 via INTRAVENOUS

## 2019-06-10 ENCOUNTER — Other Ambulatory Visit: Payer: Self-pay

## 2019-06-10 ENCOUNTER — Ambulatory Visit (INDEPENDENT_AMBULATORY_CARE_PROVIDER_SITE_OTHER): Payer: Medicare Other | Admitting: Urology

## 2019-06-10 ENCOUNTER — Encounter: Payer: Self-pay | Admitting: Urology

## 2019-06-10 VITALS — BP 125/87 | HR 88 | Temp 98.4°F | Ht 67.0 in | Wt 220.0 lb

## 2019-06-10 DIAGNOSIS — C61 Malignant neoplasm of prostate: Secondary | ICD-10-CM | POA: Diagnosis not present

## 2019-06-10 NOTE — Progress Notes (Signed)
Urological Symptom Review  Patient is experiencing the following symptoms: Frequent urination Get up at night to urinate Stream starts and stops  Kidney stone   Review of Systems  Gastrointestinal (upper)  : Negative for upper GI symptoms  Gastrointestinal (lower) : Negative for lower GI symptoms  Constitutional : Night Sweats  Skin: Negative for skin symptoms  Eyes: Negative for eye symptoms  Ear/Nose/Throat : Sinus problems  Hematologic/Lymphatic: Negative for Hematologic/Lymphatic symptoms  Cardiovascular : Negative for cardiovascular symptoms  Respiratory : Negative for respiratory symptoms  Endocrine: Negative for endocrine symptoms  Musculoskeletal: Back pain Joint pain  Neurological: Negative for neurological symptoms  Psychologic: Negative for psychiatric symptoms

## 2019-06-10 NOTE — Progress Notes (Signed)
H&P  Chief Complaint: New diagnosis of prostate cancer  History of Present Illness: This 74 year old male presents today for prostate cancer discussion.  He underwent ultrasound and biopsy of his prostate on 05/06/2019.  At that time, PSA was 7.5.  There was a left-sided prostate nodule.  Prostate volume was 81 mL.  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 2 cores (left base lateral, left mid lateral) revealed GS 4+3 pattern, 20/50% of cores involved, respectively. 1 core (left apex lateral) revealed GS 4+4 pattern, 40% of core involved  CT abdomen and pelvis revealed no evidence of extraprostatic disease.  There was diverticulosis of the descending and sigmoid colon with subtle stranding along the descending distal colon suspicious for low-grade diverticulitis.  Suspected diffuse hepatic steatosis.  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  Bilateral renal cysts were noted.  There is a solitary left mid ureteral stone 5 mm in size.  Bone scan revealed no evidence of osseous metastatic disease.    Past Medical History:  Diagnosis Date  . Arthritis    "qwhere" (01/13/2016)  . Cancer [facility])    Prostate  . Diabetes mellitus without complication ([place])   . Fatty liver   . H/O cardiovascular stress test 12/2015  . History of kidney stones    last episode 2-3 yrs. ago, but also remarks that he has had cystocopy & lithotripsy   . Hypertension   . Pre-diabetes     Past Surgical History:  Procedure Laterality Date  . CARPAL TUNNEL RELEASE Bilateral 2003-2004   left-right  . CATARACT EXTRACTION W/PHACO Right 10/29/2014   Procedure: CATARACT EXTRACTION PHACO AND INTRAOCULAR LENS PLACEMENT RIGHT EYE CDE=6.86;  Surgeon: Tonny Branch, MD;  [facility];  Service: Ophthalmology;  Laterality: Right;  . CATARACT EXTRACTION W/PHACO Left 11/26/2014   Procedure: CATARACT EXTRACTION PHACO AND INTRAOCULAR LENS PLACEMENT (IOC);  Surgeon: Tonny Branch, MD;   Location: [facility];  Service: Ophthalmology;  Laterality: Left;  CDE:5.33  . COLONOSCOPY    . CYSTOSCOPY/RETROGRADE/URETEROSCOPY/STONE EXTRACTION WITH BASKET  X 2  . JOINT REPLACEMENT    . [place]  . LITHOTRIPSY    . TONSILLECTOMY  1958  . TOTAL HIP ARTHROPLASTY Left 2012  . TOTAL SHOULDER ARTHROPLASTY Right 01/13/2016  . TOTAL SHOULDER ARTHROPLASTY Right 01/13/2016   Procedure: RIGHT TOTAL SHOULDER ARTHROPLASTY;  Surgeon: Justice Britain, MD;  Location: [place];  Service: Orthopedics;  Laterality: Right;  . TOTAL SHOULDER ARTHROPLASTY Left 02/01/2017   Procedure: TOTAL SHOULDER ARTHROPLASTY;  Surgeon: Justice Britain, MD;  Location: [place];  Service: Orthopedics;  Laterality: Left;    Home Medications:  Allergies as of 06/10/2019   No Known Allergies     Medication List       Accurate as of June 10, 2019  1:10 PM. If you have any questions, ask your nurse or doctor.        BC HEADACHE POWDER PO Take 1 packet by mouth daily.   CENTRUM SILVER PO Take 1 tablet by mouth every other day.   Cinnamon 500 MG capsule Take 2 capsules by mouth daily.   CO Q 10 PO Take 1 capsule by mouth daily.   FISH OIL PO Take 1 capsule by mouth daily.   lisinopril 5 MG tablet Commonly known as: ZESTRIL Take 5 mg by mouth daily.   metFORMIN 500 MG tablet Commonly known as: GLUCOPHAGE Take 500 mg by mouth 2 (two) times daily.   SYSTANE OP Place  1 drop into both eyes daily.       Allergies: No Known Allergies  Family History  Problem Relation Age of Onset  . Diabetes Mother   . Heart disease Mother   . Arthritis Mother   . Arthritis Father   . Alcoholism Father     Social History:  reports that he has never smoked. His smokeless tobacco use includes snuff. He reports current alcohol use of about 56.0 standard drinks of alcohol per week. He reports that he does not use drugs.  Physical Exam:  Vital signs in last 24 hours: There were no vitals taken for this  visit. Constitutional:  Alert and oriented, No acute distress Cardiovascular: Regular rate  Respiratory: Normal respiratory effort Neurologic: Grossly intact, no focal deficits Psychiatric: Normal mood and affect  I have reviewed the patient's radiographic images.  Pathology was personally reviewed and shared with the patient        The patient was counseled about the natural history of prostate cancer and the standard treatment options that are available for prostate cancer. It was explained to him how his age and life expectancy, clinical stage, Gleason score, and PSA affect his prognosis, the decision to proceed with additional staging studies, as well as how that information influences recommended treatment strategies. We discussed the roles for active surveillance, radiation therapy, surgical therapy, androgen deprivation, as well as ablative therapy options for the treatment of prostate cancer as appropriate to his individual cancer situation. We discussed the risks and benefits of these options with regard to their impact on cancer control and also in terms of potential adverse events, complications, and impact on quality of life particularly related to urinary, bowel, and sexual function. The patient was encouraged to ask questions throughout the discussion today and all questions were answered to his stated satisfaction. In addition, the patient was provided with and/or directed to appropriate resources and literature for further education about prostate cancer and treatment options.  Due to the patient's age and his high risk prostate cancer, I have recommended that he consider external beam radiotherapy in conjunction with androgen deprivation therapy.  He agrees with this.  We will set up an appointment for him to see Dr. Thressa Sheller at [facility] for radiation therapy consultation.

## 2019-06-17 DIAGNOSIS — Z79899 Other long term (current) drug therapy: Secondary | ICD-10-CM | POA: Diagnosis not present

## 2019-06-17 DIAGNOSIS — Z6833 Body mass index (BMI) 33.0-33.9, adult: Secondary | ICD-10-CM | POA: Diagnosis not present

## 2019-06-17 DIAGNOSIS — N200 Calculus of kidney: Secondary | ICD-10-CM | POA: Diagnosis not present

## 2019-06-17 DIAGNOSIS — C61 Malignant neoplasm of prostate: Secondary | ICD-10-CM | POA: Diagnosis not present

## 2019-06-17 DIAGNOSIS — K5792 Diverticulitis of intestine, part unspecified, without perforation or abscess without bleeding: Secondary | ICD-10-CM | POA: Diagnosis not present

## 2019-06-17 DIAGNOSIS — Z7984 Long term (current) use of oral hypoglycemic drugs: Secondary | ICD-10-CM | POA: Diagnosis not present

## 2019-06-17 DIAGNOSIS — Z96642 Presence of left artificial hip joint: Secondary | ICD-10-CM | POA: Diagnosis not present

## 2019-06-17 DIAGNOSIS — F17290 Nicotine dependence, other tobacco product, uncomplicated: Secondary | ICD-10-CM | POA: Diagnosis not present

## 2019-06-24 ENCOUNTER — Ambulatory Visit: Payer: Medicare Other | Admitting: Urology

## 2019-07-01 ENCOUNTER — Ambulatory Visit: Payer: Medicare Other | Admitting: Urology

## 2019-07-02 ENCOUNTER — Ambulatory Visit (INDEPENDENT_AMBULATORY_CARE_PROVIDER_SITE_OTHER): Payer: Medicare Other

## 2019-07-02 ENCOUNTER — Other Ambulatory Visit: Payer: Self-pay

## 2019-07-02 DIAGNOSIS — C61 Malignant neoplasm of prostate: Secondary | ICD-10-CM | POA: Diagnosis not present

## 2019-07-02 MED ORDER — DEGARELIX ACETATE(240 MG DOSE) 120 MG/VIAL ~~LOC~~ SOLR
240.0000 mg | Freq: Once | SUBCUTANEOUS | Status: AC
  Administered 2019-07-02: 240 mg via SUBCUTANEOUS

## 2019-08-08 ENCOUNTER — Ambulatory Visit: Payer: Medicare Other | Admitting: Urology

## 2019-08-12 ENCOUNTER — Ambulatory Visit (INDEPENDENT_AMBULATORY_CARE_PROVIDER_SITE_OTHER): Payer: Medicare Other | Admitting: Urology

## 2019-08-12 ENCOUNTER — Encounter: Payer: Self-pay | Admitting: Urology

## 2019-08-12 ENCOUNTER — Other Ambulatory Visit: Payer: Self-pay

## 2019-08-12 VITALS — BP 128/72 | HR 90 | Temp 97.7°F | Ht 68.0 in | Wt 220.0 lb

## 2019-08-12 DIAGNOSIS — C61 Malignant neoplasm of prostate: Secondary | ICD-10-CM

## 2019-08-12 MED ORDER — LEUPROLIDE ACETATE (4 MONTH) 30 MG ~~LOC~~ KIT
30.0000 mg | PACK | Freq: Once | SUBCUTANEOUS | Status: AC
  Administered 2019-08-12: 30 mg via SUBCUTANEOUS

## 2019-08-12 NOTE — Progress Notes (Signed)
H&P  Chief Complaint: Prostate Cancer  History of Present Illness:   6.8.2021: He returns today for follow-up having consulted with Dr Francesca Jewett about initiating treatment. He states that he initiated ADT with firmagon around 5 wks prior but apparently had a bad response to it -- reports intermittent episodes of increased urinary freq and a 4 day episode of hematuria.   Past Medical History:  Diagnosis Date  . Arthritis    "qwhere" (01/13/2016)  . Cancer [facility])    Prostate  . Diabetes mellitus without complication ([place])   . Fatty liver   . H/O cardiovascular stress test 12/2015  . History of kidney stones    last episode 2-3 yrs. ago, but also remarks that he has had cystocopy & lithotripsy   . Hypertension   . Pre-diabetes     Past Surgical History:  Procedure Laterality Date  . CARPAL TUNNEL RELEASE Bilateral 2003-2004   left-right  . CATARACT EXTRACTION W/PHACO Right 10/29/2014   Procedure: CATARACT EXTRACTION PHACO AND INTRAOCULAR LENS PLACEMENT RIGHT EYE CDE=6.86;  Surgeon: Tonny Branch, MD;  [facility];  Service: Ophthalmology;  Laterality: Right;  . CATARACT EXTRACTION W/PHACO Left 11/26/2014   Procedure: CATARACT EXTRACTION PHACO AND INTRAOCULAR LENS PLACEMENT (IOC);  Surgeon: Tonny Branch, MD;  Location: [facility];  Service: Ophthalmology;  Laterality: Left;  CDE:5.33  . COLONOSCOPY    . CYSTOSCOPY/RETROGRADE/URETEROSCOPY/STONE EXTRACTION WITH BASKET  X 2  . JOINT REPLACEMENT    . [place]  . LITHOTRIPSY    . TONSILLECTOMY  1958  . TOTAL HIP ARTHROPLASTY Left 2012  . TOTAL SHOULDER ARTHROPLASTY Right 01/13/2016  . TOTAL SHOULDER ARTHROPLASTY Right 01/13/2016   Procedure: RIGHT TOTAL SHOULDER ARTHROPLASTY;  Surgeon: Justice Britain, MD;  Location: [place];  Service: Orthopedics;  Laterality: Right;  . TOTAL SHOULDER ARTHROPLASTY Left 02/01/2017   Procedure: TOTAL SHOULDER ARTHROPLASTY;  Surgeon: Justice Britain, MD;  Location: [place];  Service: Orthopedics;   Laterality: Left;    Home Medications:  Allergies as of 08/12/2019   No Known Allergies     Medication List       Accurate as of August 12, 2019 11:48 AM. If you have any questions, ask your nurse or doctor.        BC HEADACHE POWDER PO Take 1 packet by mouth daily.   CENTRUM SILVER PO Take 1 tablet by mouth every other day.   Cinnamon 500 MG capsule Take 2 capsules by mouth daily.   CO Q 10 PO Take 1 capsule by mouth daily.   FISH OIL PO Take 1 capsule by mouth daily.   lisinopril 5 MG tablet Commonly known as: ZESTRIL Take 5 mg by mouth daily.   metFORMIN 500 MG tablet Commonly known as: GLUCOPHAGE Take 500 mg by mouth 2 (two) times daily.   SYSTANE OP Place 1 drop into both eyes daily.       Allergies: No Known Allergies  Family History  Problem Relation Age of Onset  . Diabetes Mother   . Heart disease Mother   . Arthritis Mother   . Arthritis Father   . Alcoholism Father     Social History:  reports that he has never smoked. His smokeless tobacco use includes snuff. He reports current alcohol use of about 56.0 standard drinks of alcohol per week. He reports that he does not use drugs.  ROS: Urological Symptom Review  Patient is experiencing the following symptoms: Frequent urination Get up at night to urinate  Review of Systems Gastrointestinal (upper)  : Negative for upper GI symptoms Gastrointestinal (lower) : Negative for lower GI symptoms Constitutional : Negative for symptoms Skin: Negative for skin symptoms Eyes: Negative for eye symptoms Ear/Nose/Throat : Sinus problems Hematologic/Lymphatic: Negative for Hematologic/Lymphatic symptoms Cardiovascular : Negative for cardiovascular symptoms Respiratory : Negative for respiratory symptoms Endocrine: Negative for endocrine symptoms Musculoskeletal: Back pain Joint pain Neurological: Negative for neurological symptoms Psychologic: Negative for psychiatric  symptoms   Physical Exam:  Vital signs in last 24 hours: BP 128/72   Pulse 90   Temp 97.7 F (36.5 C)   Ht 5\' 8"  (1.727 m)   Wt 220 lb (99.8 kg)   BMI 33.45 kg/m  Constitutional:  Alert and oriented, No acute distress Cardiovascular: Regular rate  Respiratory: Normal respiratory effort Neurologic: Grossly intact, no focal deficits Psychiatric: Normal mood and affect  I have reviewed prior pt notes  I have reviewed notes from referring/previous physicians  I have independently reviewed prior imaging  I have reviewed prior PSA results   Impression/Assessment:  PCa--unfavorable path, initiated on ADT. We will move forward with getting him ready for radiotherapy.  Plan:  1. 4 mo lupron given today    2. Will call to schedule fiducial marker placement/SpaceOAR.  3. Will also coordinate with Dr Francesca Jewett to get him set up to initiate treatment.  4. Return for OV in 4 mo w/ lupron

## 2019-08-12 NOTE — Progress Notes (Signed)
Eligard SubQ Injection   Due to Prostate Cancer patient is present today for a Eligard Injection.  Medication: Eligard   4 month Dose: 30 mg  Location: left  Lot: [id number] Exp: 09/22  Patient tolerated well, no complications were noted  Performed by: Antionette Char, Shareef Eddinger,LPN  Per Dr. Diona Fanti patient is to continue therapy for 1 year . Patient's next follow up was scheduled for 4 months. This appointment was scheduled using wheel and given to patient today along with reminder continue on Vitamin D 800-1000iu and Calium 1000-1200mg  daily while on Androgen Deprivation Therapy.  PA approval dates:

## 2019-08-12 NOTE — Progress Notes (Signed)
See progress notes

## 2019-08-18 DIAGNOSIS — E1169 Type 2 diabetes mellitus with other specified complication: Secondary | ICD-10-CM | POA: Diagnosis not present

## 2019-08-18 DIAGNOSIS — I10 Essential (primary) hypertension: Secondary | ICD-10-CM | POA: Diagnosis not present

## 2019-08-18 DIAGNOSIS — E782 Mixed hyperlipidemia: Secondary | ICD-10-CM | POA: Diagnosis not present

## 2019-08-18 DIAGNOSIS — E7800 Pure hypercholesterolemia, unspecified: Secondary | ICD-10-CM | POA: Diagnosis not present

## 2019-08-18 DIAGNOSIS — F17220 Nicotine dependence, chewing tobacco, uncomplicated: Secondary | ICD-10-CM | POA: Diagnosis not present

## 2019-08-18 DIAGNOSIS — K760 Fatty (change of) liver, not elsewhere classified: Secondary | ICD-10-CM | POA: Diagnosis not present

## 2019-08-19 ENCOUNTER — Other Ambulatory Visit: Payer: Self-pay

## 2019-08-19 ENCOUNTER — Telehealth: Payer: Self-pay

## 2019-08-19 DIAGNOSIS — C61 Malignant neoplasm of prostate: Secondary | ICD-10-CM

## 2019-08-19 MED ORDER — SULFAMETHOXAZOLE-TRIMETHOPRIM 800-160 MG PO TABS: 1.0000 | ORAL_TABLET | Freq: Two times a day (BID) | ORAL | 0 refills | Status: DC

## 2019-08-19 NOTE — Telephone Encounter (Signed)
Called pt. Left message to return call regarding space OAR instructions for [facility].

## 2019-08-22 NOTE — Telephone Encounter (Signed)
Spoke with pt and reviewed space OAR. Instructions and mailed.

## 2019-09-02 DIAGNOSIS — C61 Malignant neoplasm of prostate: Secondary | ICD-10-CM | POA: Diagnosis not present

## 2019-09-09 DIAGNOSIS — K5792 Diverticulitis of intestine, part unspecified, without perforation or abscess without bleeding: Secondary | ICD-10-CM | POA: Diagnosis not present

## 2019-09-09 DIAGNOSIS — Z7984 Long term (current) use of oral hypoglycemic drugs: Secondary | ICD-10-CM | POA: Diagnosis not present

## 2019-09-09 DIAGNOSIS — N200 Calculus of kidney: Secondary | ICD-10-CM | POA: Diagnosis not present

## 2019-09-09 DIAGNOSIS — F17290 Nicotine dependence, other tobacco product, uncomplicated: Secondary | ICD-10-CM | POA: Diagnosis not present

## 2019-09-09 DIAGNOSIS — Z79899 Other long term (current) drug therapy: Secondary | ICD-10-CM | POA: Diagnosis not present

## 2019-09-09 DIAGNOSIS — C61 Malignant neoplasm of prostate: Secondary | ICD-10-CM | POA: Diagnosis not present

## 2019-09-09 DIAGNOSIS — Z96642 Presence of left artificial hip joint: Secondary | ICD-10-CM | POA: Diagnosis not present

## 2019-09-12 DIAGNOSIS — Z7984 Long term (current) use of oral hypoglycemic drugs: Secondary | ICD-10-CM | POA: Diagnosis not present

## 2019-09-12 DIAGNOSIS — F17290 Nicotine dependence, other tobacco product, uncomplicated: Secondary | ICD-10-CM | POA: Diagnosis not present

## 2019-09-12 DIAGNOSIS — Z79899 Other long term (current) drug therapy: Secondary | ICD-10-CM | POA: Diagnosis not present

## 2019-09-12 DIAGNOSIS — Z96642 Presence of left artificial hip joint: Secondary | ICD-10-CM | POA: Diagnosis not present

## 2019-09-12 DIAGNOSIS — K5792 Diverticulitis of intestine, part unspecified, without perforation or abscess without bleeding: Secondary | ICD-10-CM | POA: Diagnosis not present

## 2019-09-12 DIAGNOSIS — C61 Malignant neoplasm of prostate: Secondary | ICD-10-CM | POA: Diagnosis not present

## 2019-09-15 DIAGNOSIS — K5792 Diverticulitis of intestine, part unspecified, without perforation or abscess without bleeding: Secondary | ICD-10-CM | POA: Diagnosis not present

## 2019-09-15 DIAGNOSIS — Z79899 Other long term (current) drug therapy: Secondary | ICD-10-CM | POA: Diagnosis not present

## 2019-09-15 DIAGNOSIS — C61 Malignant neoplasm of prostate: Secondary | ICD-10-CM | POA: Diagnosis not present

## 2019-09-15 DIAGNOSIS — Z7984 Long term (current) use of oral hypoglycemic drugs: Secondary | ICD-10-CM | POA: Diagnosis not present

## 2019-09-15 DIAGNOSIS — F17290 Nicotine dependence, other tobacco product, uncomplicated: Secondary | ICD-10-CM | POA: Diagnosis not present

## 2019-09-15 DIAGNOSIS — Z96642 Presence of left artificial hip joint: Secondary | ICD-10-CM | POA: Diagnosis not present

## 2019-09-22 DIAGNOSIS — K5792 Diverticulitis of intestine, part unspecified, without perforation or abscess without bleeding: Secondary | ICD-10-CM | POA: Diagnosis not present

## 2019-09-22 DIAGNOSIS — Z7984 Long term (current) use of oral hypoglycemic drugs: Secondary | ICD-10-CM | POA: Diagnosis not present

## 2019-09-22 DIAGNOSIS — C61 Malignant neoplasm of prostate: Secondary | ICD-10-CM | POA: Diagnosis not present

## 2019-09-22 DIAGNOSIS — R300 Dysuria: Secondary | ICD-10-CM | POA: Diagnosis not present

## 2019-09-22 DIAGNOSIS — F17290 Nicotine dependence, other tobacco product, uncomplicated: Secondary | ICD-10-CM | POA: Diagnosis not present

## 2019-09-22 DIAGNOSIS — N200 Calculus of kidney: Secondary | ICD-10-CM | POA: Diagnosis not present

## 2019-09-22 DIAGNOSIS — Z79899 Other long term (current) drug therapy: Secondary | ICD-10-CM | POA: Diagnosis not present

## 2019-09-22 DIAGNOSIS — Z96642 Presence of left artificial hip joint: Secondary | ICD-10-CM | POA: Diagnosis not present

## 2019-09-23 DIAGNOSIS — K5792 Diverticulitis of intestine, part unspecified, without perforation or abscess without bleeding: Secondary | ICD-10-CM | POA: Diagnosis not present

## 2019-09-23 DIAGNOSIS — Z79899 Other long term (current) drug therapy: Secondary | ICD-10-CM | POA: Diagnosis not present

## 2019-09-23 DIAGNOSIS — Z96642 Presence of left artificial hip joint: Secondary | ICD-10-CM | POA: Diagnosis not present

## 2019-09-23 DIAGNOSIS — F17290 Nicotine dependence, other tobacco product, uncomplicated: Secondary | ICD-10-CM | POA: Diagnosis not present

## 2019-09-23 DIAGNOSIS — C61 Malignant neoplasm of prostate: Secondary | ICD-10-CM | POA: Diagnosis not present

## 2019-09-23 DIAGNOSIS — Z7984 Long term (current) use of oral hypoglycemic drugs: Secondary | ICD-10-CM | POA: Diagnosis not present

## 2019-09-24 DIAGNOSIS — Z79899 Other long term (current) drug therapy: Secondary | ICD-10-CM | POA: Diagnosis not present

## 2019-09-24 DIAGNOSIS — Z7984 Long term (current) use of oral hypoglycemic drugs: Secondary | ICD-10-CM | POA: Diagnosis not present

## 2019-09-24 DIAGNOSIS — K5792 Diverticulitis of intestine, part unspecified, without perforation or abscess without bleeding: Secondary | ICD-10-CM | POA: Diagnosis not present

## 2019-09-24 DIAGNOSIS — F17290 Nicotine dependence, other tobacco product, uncomplicated: Secondary | ICD-10-CM | POA: Diagnosis not present

## 2019-09-24 DIAGNOSIS — Z96642 Presence of left artificial hip joint: Secondary | ICD-10-CM | POA: Diagnosis not present

## 2019-09-24 DIAGNOSIS — C61 Malignant neoplasm of prostate: Secondary | ICD-10-CM | POA: Diagnosis not present

## 2019-09-25 DIAGNOSIS — K5792 Diverticulitis of intestine, part unspecified, without perforation or abscess without bleeding: Secondary | ICD-10-CM | POA: Diagnosis not present

## 2019-09-25 DIAGNOSIS — Z96642 Presence of left artificial hip joint: Secondary | ICD-10-CM | POA: Diagnosis not present

## 2019-09-25 DIAGNOSIS — Z7984 Long term (current) use of oral hypoglycemic drugs: Secondary | ICD-10-CM | POA: Diagnosis not present

## 2019-09-25 DIAGNOSIS — Z79899 Other long term (current) drug therapy: Secondary | ICD-10-CM | POA: Diagnosis not present

## 2019-09-25 DIAGNOSIS — C61 Malignant neoplasm of prostate: Secondary | ICD-10-CM | POA: Diagnosis not present

## 2019-09-25 DIAGNOSIS — F17290 Nicotine dependence, other tobacco product, uncomplicated: Secondary | ICD-10-CM | POA: Diagnosis not present

## 2019-09-26 DIAGNOSIS — Z96642 Presence of left artificial hip joint: Secondary | ICD-10-CM | POA: Diagnosis not present

## 2019-09-26 DIAGNOSIS — K5792 Diverticulitis of intestine, part unspecified, without perforation or abscess without bleeding: Secondary | ICD-10-CM | POA: Diagnosis not present

## 2019-09-26 DIAGNOSIS — Z79899 Other long term (current) drug therapy: Secondary | ICD-10-CM | POA: Diagnosis not present

## 2019-09-26 DIAGNOSIS — F17290 Nicotine dependence, other tobacco product, uncomplicated: Secondary | ICD-10-CM | POA: Diagnosis not present

## 2019-09-26 DIAGNOSIS — Z7984 Long term (current) use of oral hypoglycemic drugs: Secondary | ICD-10-CM | POA: Diagnosis not present

## 2019-09-26 DIAGNOSIS — C61 Malignant neoplasm of prostate: Secondary | ICD-10-CM | POA: Diagnosis not present

## 2019-09-29 DIAGNOSIS — C61 Malignant neoplasm of prostate: Secondary | ICD-10-CM | POA: Diagnosis not present

## 2019-09-29 DIAGNOSIS — K5792 Diverticulitis of intestine, part unspecified, without perforation or abscess without bleeding: Secondary | ICD-10-CM | POA: Diagnosis not present

## 2019-09-29 DIAGNOSIS — Z96642 Presence of left artificial hip joint: Secondary | ICD-10-CM | POA: Diagnosis not present

## 2019-09-29 DIAGNOSIS — F17290 Nicotine dependence, other tobacco product, uncomplicated: Secondary | ICD-10-CM | POA: Diagnosis not present

## 2019-09-29 DIAGNOSIS — Z7984 Long term (current) use of oral hypoglycemic drugs: Secondary | ICD-10-CM | POA: Diagnosis not present

## 2019-09-29 DIAGNOSIS — Z79899 Other long term (current) drug therapy: Secondary | ICD-10-CM | POA: Diagnosis not present

## 2019-09-29 DIAGNOSIS — R300 Dysuria: Secondary | ICD-10-CM | POA: Diagnosis not present

## 2019-09-30 DIAGNOSIS — C61 Malignant neoplasm of prostate: Secondary | ICD-10-CM | POA: Diagnosis not present

## 2019-09-30 DIAGNOSIS — F17290 Nicotine dependence, other tobacco product, uncomplicated: Secondary | ICD-10-CM | POA: Diagnosis not present

## 2019-09-30 DIAGNOSIS — Z96642 Presence of left artificial hip joint: Secondary | ICD-10-CM | POA: Diagnosis not present

## 2019-09-30 DIAGNOSIS — K5792 Diverticulitis of intestine, part unspecified, without perforation or abscess without bleeding: Secondary | ICD-10-CM | POA: Diagnosis not present

## 2019-09-30 DIAGNOSIS — Z7984 Long term (current) use of oral hypoglycemic drugs: Secondary | ICD-10-CM | POA: Diagnosis not present

## 2019-09-30 DIAGNOSIS — Z79899 Other long term (current) drug therapy: Secondary | ICD-10-CM | POA: Diagnosis not present

## 2019-10-01 DIAGNOSIS — Z7984 Long term (current) use of oral hypoglycemic drugs: Secondary | ICD-10-CM | POA: Diagnosis not present

## 2019-10-01 DIAGNOSIS — C61 Malignant neoplasm of prostate: Secondary | ICD-10-CM | POA: Diagnosis not present

## 2019-10-01 DIAGNOSIS — Z79899 Other long term (current) drug therapy: Secondary | ICD-10-CM | POA: Diagnosis not present

## 2019-10-01 DIAGNOSIS — K5792 Diverticulitis of intestine, part unspecified, without perforation or abscess without bleeding: Secondary | ICD-10-CM | POA: Diagnosis not present

## 2019-10-01 DIAGNOSIS — F17290 Nicotine dependence, other tobacco product, uncomplicated: Secondary | ICD-10-CM | POA: Diagnosis not present

## 2019-10-01 DIAGNOSIS — Z96642 Presence of left artificial hip joint: Secondary | ICD-10-CM | POA: Diagnosis not present

## 2019-10-02 DIAGNOSIS — F17290 Nicotine dependence, other tobacco product, uncomplicated: Secondary | ICD-10-CM | POA: Diagnosis not present

## 2019-10-02 DIAGNOSIS — C61 Malignant neoplasm of prostate: Secondary | ICD-10-CM | POA: Diagnosis not present

## 2019-10-02 DIAGNOSIS — K5792 Diverticulitis of intestine, part unspecified, without perforation or abscess without bleeding: Secondary | ICD-10-CM | POA: Diagnosis not present

## 2019-10-02 DIAGNOSIS — Z79899 Other long term (current) drug therapy: Secondary | ICD-10-CM | POA: Diagnosis not present

## 2019-10-02 DIAGNOSIS — Z7984 Long term (current) use of oral hypoglycemic drugs: Secondary | ICD-10-CM | POA: Diagnosis not present

## 2019-10-02 DIAGNOSIS — Z96642 Presence of left artificial hip joint: Secondary | ICD-10-CM | POA: Diagnosis not present

## 2019-10-03 DIAGNOSIS — C61 Malignant neoplasm of prostate: Secondary | ICD-10-CM | POA: Diagnosis not present

## 2019-10-03 DIAGNOSIS — K5792 Diverticulitis of intestine, part unspecified, without perforation or abscess without bleeding: Secondary | ICD-10-CM | POA: Diagnosis not present

## 2019-10-03 DIAGNOSIS — F17290 Nicotine dependence, other tobacco product, uncomplicated: Secondary | ICD-10-CM | POA: Diagnosis not present

## 2019-10-03 DIAGNOSIS — Z7984 Long term (current) use of oral hypoglycemic drugs: Secondary | ICD-10-CM | POA: Diagnosis not present

## 2019-10-03 DIAGNOSIS — Z79899 Other long term (current) drug therapy: Secondary | ICD-10-CM | POA: Diagnosis not present

## 2019-10-03 DIAGNOSIS — Z96642 Presence of left artificial hip joint: Secondary | ICD-10-CM | POA: Diagnosis not present

## 2019-10-06 DIAGNOSIS — N200 Calculus of kidney: Secondary | ICD-10-CM | POA: Diagnosis not present

## 2019-10-06 DIAGNOSIS — C61 Malignant neoplasm of prostate: Secondary | ICD-10-CM | POA: Diagnosis not present

## 2019-10-06 DIAGNOSIS — Z79899 Other long term (current) drug therapy: Secondary | ICD-10-CM | POA: Diagnosis not present

## 2019-10-06 DIAGNOSIS — F17290 Nicotine dependence, other tobacco product, uncomplicated: Secondary | ICD-10-CM | POA: Diagnosis not present

## 2019-10-06 DIAGNOSIS — K5792 Diverticulitis of intestine, part unspecified, without perforation or abscess without bleeding: Secondary | ICD-10-CM | POA: Diagnosis not present

## 2019-10-06 DIAGNOSIS — Z96642 Presence of left artificial hip joint: Secondary | ICD-10-CM | POA: Diagnosis not present

## 2019-10-06 DIAGNOSIS — Z7984 Long term (current) use of oral hypoglycemic drugs: Secondary | ICD-10-CM | POA: Diagnosis not present

## 2019-10-07 DIAGNOSIS — F17290 Nicotine dependence, other tobacco product, uncomplicated: Secondary | ICD-10-CM | POA: Diagnosis not present

## 2019-10-07 DIAGNOSIS — Z7984 Long term (current) use of oral hypoglycemic drugs: Secondary | ICD-10-CM | POA: Diagnosis not present

## 2019-10-07 DIAGNOSIS — Z79899 Other long term (current) drug therapy: Secondary | ICD-10-CM | POA: Diagnosis not present

## 2019-10-07 DIAGNOSIS — Z96642 Presence of left artificial hip joint: Secondary | ICD-10-CM | POA: Diagnosis not present

## 2019-10-07 DIAGNOSIS — C61 Malignant neoplasm of prostate: Secondary | ICD-10-CM | POA: Diagnosis not present

## 2019-10-07 DIAGNOSIS — K5792 Diverticulitis of intestine, part unspecified, without perforation or abscess without bleeding: Secondary | ICD-10-CM | POA: Diagnosis not present

## 2019-10-08 DIAGNOSIS — Z7984 Long term (current) use of oral hypoglycemic drugs: Secondary | ICD-10-CM | POA: Diagnosis not present

## 2019-10-08 DIAGNOSIS — Z79899 Other long term (current) drug therapy: Secondary | ICD-10-CM | POA: Diagnosis not present

## 2019-10-08 DIAGNOSIS — F17290 Nicotine dependence, other tobacco product, uncomplicated: Secondary | ICD-10-CM | POA: Diagnosis not present

## 2019-10-08 DIAGNOSIS — Z96642 Presence of left artificial hip joint: Secondary | ICD-10-CM | POA: Diagnosis not present

## 2019-10-08 DIAGNOSIS — C61 Malignant neoplasm of prostate: Secondary | ICD-10-CM | POA: Diagnosis not present

## 2019-10-08 DIAGNOSIS — K5792 Diverticulitis of intestine, part unspecified, without perforation or abscess without bleeding: Secondary | ICD-10-CM | POA: Diagnosis not present

## 2019-10-09 DIAGNOSIS — Z7984 Long term (current) use of oral hypoglycemic drugs: Secondary | ICD-10-CM | POA: Diagnosis not present

## 2019-10-09 DIAGNOSIS — C61 Malignant neoplasm of prostate: Secondary | ICD-10-CM | POA: Diagnosis not present

## 2019-10-09 DIAGNOSIS — Z79899 Other long term (current) drug therapy: Secondary | ICD-10-CM | POA: Diagnosis not present

## 2019-10-09 DIAGNOSIS — Z96642 Presence of left artificial hip joint: Secondary | ICD-10-CM | POA: Diagnosis not present

## 2019-10-09 DIAGNOSIS — F17290 Nicotine dependence, other tobacco product, uncomplicated: Secondary | ICD-10-CM | POA: Diagnosis not present

## 2019-10-09 DIAGNOSIS — K5792 Diverticulitis of intestine, part unspecified, without perforation or abscess without bleeding: Secondary | ICD-10-CM | POA: Diagnosis not present

## 2019-10-10 DIAGNOSIS — C61 Malignant neoplasm of prostate: Secondary | ICD-10-CM | POA: Diagnosis not present

## 2019-10-10 DIAGNOSIS — K5792 Diverticulitis of intestine, part unspecified, without perforation or abscess without bleeding: Secondary | ICD-10-CM | POA: Diagnosis not present

## 2019-10-10 DIAGNOSIS — Z79899 Other long term (current) drug therapy: Secondary | ICD-10-CM | POA: Diagnosis not present

## 2019-10-10 DIAGNOSIS — Z7984 Long term (current) use of oral hypoglycemic drugs: Secondary | ICD-10-CM | POA: Diagnosis not present

## 2019-10-10 DIAGNOSIS — F17290 Nicotine dependence, other tobacco product, uncomplicated: Secondary | ICD-10-CM | POA: Diagnosis not present

## 2019-10-10 DIAGNOSIS — Z96642 Presence of left artificial hip joint: Secondary | ICD-10-CM | POA: Diagnosis not present

## 2019-10-13 DIAGNOSIS — Z96642 Presence of left artificial hip joint: Secondary | ICD-10-CM | POA: Diagnosis not present

## 2019-10-13 DIAGNOSIS — Z79899 Other long term (current) drug therapy: Secondary | ICD-10-CM | POA: Diagnosis not present

## 2019-10-13 DIAGNOSIS — F17290 Nicotine dependence, other tobacco product, uncomplicated: Secondary | ICD-10-CM | POA: Diagnosis not present

## 2019-10-13 DIAGNOSIS — C61 Malignant neoplasm of prostate: Secondary | ICD-10-CM | POA: Diagnosis not present

## 2019-10-13 DIAGNOSIS — Z7984 Long term (current) use of oral hypoglycemic drugs: Secondary | ICD-10-CM | POA: Diagnosis not present

## 2019-10-13 DIAGNOSIS — K5792 Diverticulitis of intestine, part unspecified, without perforation or abscess without bleeding: Secondary | ICD-10-CM | POA: Diagnosis not present

## 2019-10-14 DIAGNOSIS — K5792 Diverticulitis of intestine, part unspecified, without perforation or abscess without bleeding: Secondary | ICD-10-CM | POA: Diagnosis not present

## 2019-10-14 DIAGNOSIS — Z7984 Long term (current) use of oral hypoglycemic drugs: Secondary | ICD-10-CM | POA: Diagnosis not present

## 2019-10-14 DIAGNOSIS — F17290 Nicotine dependence, other tobacco product, uncomplicated: Secondary | ICD-10-CM | POA: Diagnosis not present

## 2019-10-14 DIAGNOSIS — C61 Malignant neoplasm of prostate: Secondary | ICD-10-CM | POA: Diagnosis not present

## 2019-10-14 DIAGNOSIS — Z79899 Other long term (current) drug therapy: Secondary | ICD-10-CM | POA: Diagnosis not present

## 2019-10-14 DIAGNOSIS — Z96642 Presence of left artificial hip joint: Secondary | ICD-10-CM | POA: Diagnosis not present

## 2019-10-15 DIAGNOSIS — K5792 Diverticulitis of intestine, part unspecified, without perforation or abscess without bleeding: Secondary | ICD-10-CM | POA: Diagnosis not present

## 2019-10-15 DIAGNOSIS — Z7984 Long term (current) use of oral hypoglycemic drugs: Secondary | ICD-10-CM | POA: Diagnosis not present

## 2019-10-15 DIAGNOSIS — Z79899 Other long term (current) drug therapy: Secondary | ICD-10-CM | POA: Diagnosis not present

## 2019-10-15 DIAGNOSIS — F17290 Nicotine dependence, other tobacco product, uncomplicated: Secondary | ICD-10-CM | POA: Diagnosis not present

## 2019-10-15 DIAGNOSIS — C61 Malignant neoplasm of prostate: Secondary | ICD-10-CM | POA: Diagnosis not present

## 2019-10-15 DIAGNOSIS — Z96642 Presence of left artificial hip joint: Secondary | ICD-10-CM | POA: Diagnosis not present

## 2019-10-16 DIAGNOSIS — C61 Malignant neoplasm of prostate: Secondary | ICD-10-CM | POA: Diagnosis not present

## 2019-10-16 DIAGNOSIS — F17290 Nicotine dependence, other tobacco product, uncomplicated: Secondary | ICD-10-CM | POA: Diagnosis not present

## 2019-10-16 DIAGNOSIS — Z96642 Presence of left artificial hip joint: Secondary | ICD-10-CM | POA: Diagnosis not present

## 2019-10-16 DIAGNOSIS — K5792 Diverticulitis of intestine, part unspecified, without perforation or abscess without bleeding: Secondary | ICD-10-CM | POA: Diagnosis not present

## 2019-10-16 DIAGNOSIS — Z79899 Other long term (current) drug therapy: Secondary | ICD-10-CM | POA: Diagnosis not present

## 2019-10-16 DIAGNOSIS — Z7984 Long term (current) use of oral hypoglycemic drugs: Secondary | ICD-10-CM | POA: Diagnosis not present

## 2019-10-17 DIAGNOSIS — Z7984 Long term (current) use of oral hypoglycemic drugs: Secondary | ICD-10-CM | POA: Diagnosis not present

## 2019-10-17 DIAGNOSIS — K5792 Diverticulitis of intestine, part unspecified, without perforation or abscess without bleeding: Secondary | ICD-10-CM | POA: Diagnosis not present

## 2019-10-17 DIAGNOSIS — Z96642 Presence of left artificial hip joint: Secondary | ICD-10-CM | POA: Diagnosis not present

## 2019-10-17 DIAGNOSIS — F17290 Nicotine dependence, other tobacco product, uncomplicated: Secondary | ICD-10-CM | POA: Diagnosis not present

## 2019-10-17 DIAGNOSIS — Z79899 Other long term (current) drug therapy: Secondary | ICD-10-CM | POA: Diagnosis not present

## 2019-10-17 DIAGNOSIS — C61 Malignant neoplasm of prostate: Secondary | ICD-10-CM | POA: Diagnosis not present

## 2019-10-20 DIAGNOSIS — K5792 Diverticulitis of intestine, part unspecified, without perforation or abscess without bleeding: Secondary | ICD-10-CM | POA: Diagnosis not present

## 2019-10-20 DIAGNOSIS — Z79899 Other long term (current) drug therapy: Secondary | ICD-10-CM | POA: Diagnosis not present

## 2019-10-20 DIAGNOSIS — Z96642 Presence of left artificial hip joint: Secondary | ICD-10-CM | POA: Diagnosis not present

## 2019-10-20 DIAGNOSIS — Z7984 Long term (current) use of oral hypoglycemic drugs: Secondary | ICD-10-CM | POA: Diagnosis not present

## 2019-10-20 DIAGNOSIS — F17290 Nicotine dependence, other tobacco product, uncomplicated: Secondary | ICD-10-CM | POA: Diagnosis not present

## 2019-10-20 DIAGNOSIS — C61 Malignant neoplasm of prostate: Secondary | ICD-10-CM | POA: Diagnosis not present

## 2019-10-21 DIAGNOSIS — C61 Malignant neoplasm of prostate: Secondary | ICD-10-CM | POA: Diagnosis not present

## 2019-10-21 DIAGNOSIS — Z79899 Other long term (current) drug therapy: Secondary | ICD-10-CM | POA: Diagnosis not present

## 2019-10-21 DIAGNOSIS — Z96642 Presence of left artificial hip joint: Secondary | ICD-10-CM | POA: Diagnosis not present

## 2019-10-21 DIAGNOSIS — Z7984 Long term (current) use of oral hypoglycemic drugs: Secondary | ICD-10-CM | POA: Diagnosis not present

## 2019-10-21 DIAGNOSIS — K5792 Diverticulitis of intestine, part unspecified, without perforation or abscess without bleeding: Secondary | ICD-10-CM | POA: Diagnosis not present

## 2019-10-21 DIAGNOSIS — F17290 Nicotine dependence, other tobacco product, uncomplicated: Secondary | ICD-10-CM | POA: Diagnosis not present

## 2019-10-22 DIAGNOSIS — C61 Malignant neoplasm of prostate: Secondary | ICD-10-CM | POA: Diagnosis not present

## 2019-10-22 DIAGNOSIS — F17290 Nicotine dependence, other tobacco product, uncomplicated: Secondary | ICD-10-CM | POA: Diagnosis not present

## 2019-10-22 DIAGNOSIS — K5792 Diverticulitis of intestine, part unspecified, without perforation or abscess without bleeding: Secondary | ICD-10-CM | POA: Diagnosis not present

## 2019-10-22 DIAGNOSIS — Z7984 Long term (current) use of oral hypoglycemic drugs: Secondary | ICD-10-CM | POA: Diagnosis not present

## 2019-10-22 DIAGNOSIS — Z96642 Presence of left artificial hip joint: Secondary | ICD-10-CM | POA: Diagnosis not present

## 2019-10-22 DIAGNOSIS — Z79899 Other long term (current) drug therapy: Secondary | ICD-10-CM | POA: Diagnosis not present

## 2019-10-23 DIAGNOSIS — Z79899 Other long term (current) drug therapy: Secondary | ICD-10-CM | POA: Diagnosis not present

## 2019-10-23 DIAGNOSIS — K5792 Diverticulitis of intestine, part unspecified, without perforation or abscess without bleeding: Secondary | ICD-10-CM | POA: Diagnosis not present

## 2019-10-23 DIAGNOSIS — F17290 Nicotine dependence, other tobacco product, uncomplicated: Secondary | ICD-10-CM | POA: Diagnosis not present

## 2019-10-23 DIAGNOSIS — C61 Malignant neoplasm of prostate: Secondary | ICD-10-CM | POA: Diagnosis not present

## 2019-10-23 DIAGNOSIS — Z7984 Long term (current) use of oral hypoglycemic drugs: Secondary | ICD-10-CM | POA: Diagnosis not present

## 2019-10-23 DIAGNOSIS — Z96642 Presence of left artificial hip joint: Secondary | ICD-10-CM | POA: Diagnosis not present

## 2019-10-24 DIAGNOSIS — Z79899 Other long term (current) drug therapy: Secondary | ICD-10-CM | POA: Diagnosis not present

## 2019-10-24 DIAGNOSIS — Z96642 Presence of left artificial hip joint: Secondary | ICD-10-CM | POA: Diagnosis not present

## 2019-10-24 DIAGNOSIS — C61 Malignant neoplasm of prostate: Secondary | ICD-10-CM | POA: Diagnosis not present

## 2019-10-24 DIAGNOSIS — K5792 Diverticulitis of intestine, part unspecified, without perforation or abscess without bleeding: Secondary | ICD-10-CM | POA: Diagnosis not present

## 2019-10-24 DIAGNOSIS — Z7984 Long term (current) use of oral hypoglycemic drugs: Secondary | ICD-10-CM | POA: Diagnosis not present

## 2019-10-24 DIAGNOSIS — F17290 Nicotine dependence, other tobacco product, uncomplicated: Secondary | ICD-10-CM | POA: Diagnosis not present

## 2019-10-27 DIAGNOSIS — Z7984 Long term (current) use of oral hypoglycemic drugs: Secondary | ICD-10-CM | POA: Diagnosis not present

## 2019-10-27 DIAGNOSIS — Z79899 Other long term (current) drug therapy: Secondary | ICD-10-CM | POA: Diagnosis not present

## 2019-10-27 DIAGNOSIS — K5792 Diverticulitis of intestine, part unspecified, without perforation or abscess without bleeding: Secondary | ICD-10-CM | POA: Diagnosis not present

## 2019-10-27 DIAGNOSIS — F17290 Nicotine dependence, other tobacco product, uncomplicated: Secondary | ICD-10-CM | POA: Diagnosis not present

## 2019-10-27 DIAGNOSIS — Z96642 Presence of left artificial hip joint: Secondary | ICD-10-CM | POA: Diagnosis not present

## 2019-10-27 DIAGNOSIS — C61 Malignant neoplasm of prostate: Secondary | ICD-10-CM | POA: Diagnosis not present

## 2019-10-28 DIAGNOSIS — Z7984 Long term (current) use of oral hypoglycemic drugs: Secondary | ICD-10-CM | POA: Diagnosis not present

## 2019-10-28 DIAGNOSIS — F17290 Nicotine dependence, other tobacco product, uncomplicated: Secondary | ICD-10-CM | POA: Diagnosis not present

## 2019-10-28 DIAGNOSIS — Z96642 Presence of left artificial hip joint: Secondary | ICD-10-CM | POA: Diagnosis not present

## 2019-10-28 DIAGNOSIS — K5792 Diverticulitis of intestine, part unspecified, without perforation or abscess without bleeding: Secondary | ICD-10-CM | POA: Diagnosis not present

## 2019-10-28 DIAGNOSIS — C61 Malignant neoplasm of prostate: Secondary | ICD-10-CM | POA: Diagnosis not present

## 2019-10-28 DIAGNOSIS — Z79899 Other long term (current) drug therapy: Secondary | ICD-10-CM | POA: Diagnosis not present

## 2019-10-29 DIAGNOSIS — F17290 Nicotine dependence, other tobacco product, uncomplicated: Secondary | ICD-10-CM | POA: Diagnosis not present

## 2019-10-29 DIAGNOSIS — Z79899 Other long term (current) drug therapy: Secondary | ICD-10-CM | POA: Diagnosis not present

## 2019-10-29 DIAGNOSIS — Z96642 Presence of left artificial hip joint: Secondary | ICD-10-CM | POA: Diagnosis not present

## 2019-10-29 DIAGNOSIS — C61 Malignant neoplasm of prostate: Secondary | ICD-10-CM | POA: Diagnosis not present

## 2019-10-29 DIAGNOSIS — Z7984 Long term (current) use of oral hypoglycemic drugs: Secondary | ICD-10-CM | POA: Diagnosis not present

## 2019-10-29 DIAGNOSIS — K5792 Diverticulitis of intestine, part unspecified, without perforation or abscess without bleeding: Secondary | ICD-10-CM | POA: Diagnosis not present

## 2019-11-24 DIAGNOSIS — M25551 Pain in right hip: Secondary | ICD-10-CM | POA: Diagnosis not present

## 2019-11-24 DIAGNOSIS — M25561 Pain in right knee: Secondary | ICD-10-CM | POA: Diagnosis not present

## 2019-11-24 DIAGNOSIS — M5416 Radiculopathy, lumbar region: Secondary | ICD-10-CM | POA: Diagnosis not present

## 2019-12-02 DIAGNOSIS — N200 Calculus of kidney: Secondary | ICD-10-CM | POA: Diagnosis not present

## 2019-12-02 DIAGNOSIS — F17290 Nicotine dependence, other tobacco product, uncomplicated: Secondary | ICD-10-CM | POA: Diagnosis not present

## 2019-12-02 DIAGNOSIS — Z7984 Long term (current) use of oral hypoglycemic drugs: Secondary | ICD-10-CM | POA: Diagnosis not present

## 2019-12-02 DIAGNOSIS — Z96642 Presence of left artificial hip joint: Secondary | ICD-10-CM | POA: Diagnosis not present

## 2019-12-02 DIAGNOSIS — K5792 Diverticulitis of intestine, part unspecified, without perforation or abscess without bleeding: Secondary | ICD-10-CM | POA: Diagnosis not present

## 2019-12-02 DIAGNOSIS — Z79899 Other long term (current) drug therapy: Secondary | ICD-10-CM | POA: Diagnosis not present

## 2019-12-02 DIAGNOSIS — C61 Malignant neoplasm of prostate: Secondary | ICD-10-CM | POA: Diagnosis not present

## 2019-12-08 DIAGNOSIS — D126 Benign neoplasm of colon, unspecified: Secondary | ICD-10-CM | POA: Diagnosis not present

## 2019-12-08 DIAGNOSIS — Z23 Encounter for immunization: Secondary | ICD-10-CM | POA: Diagnosis not present

## 2019-12-08 DIAGNOSIS — C61 Malignant neoplasm of prostate: Secondary | ICD-10-CM | POA: Diagnosis not present

## 2019-12-08 DIAGNOSIS — I10 Essential (primary) hypertension: Secondary | ICD-10-CM | POA: Diagnosis not present

## 2019-12-08 DIAGNOSIS — R7989 Other specified abnormal findings of blood chemistry: Secondary | ICD-10-CM | POA: Diagnosis not present

## 2019-12-08 DIAGNOSIS — E1169 Type 2 diabetes mellitus with other specified complication: Secondary | ICD-10-CM | POA: Diagnosis not present

## 2019-12-08 DIAGNOSIS — E782 Mixed hyperlipidemia: Secondary | ICD-10-CM | POA: Diagnosis not present

## 2019-12-08 DIAGNOSIS — K760 Fatty (change of) liver, not elsewhere classified: Secondary | ICD-10-CM | POA: Diagnosis not present

## 2019-12-16 ENCOUNTER — Ambulatory Visit: Payer: Medicare Other | Admitting: Urology

## 2019-12-18 DIAGNOSIS — M5136 Other intervertebral disc degeneration, lumbar region: Secondary | ICD-10-CM | POA: Diagnosis not present

## 2019-12-23 ENCOUNTER — Ambulatory Visit (INDEPENDENT_AMBULATORY_CARE_PROVIDER_SITE_OTHER): Payer: Medicare Other | Admitting: Urology

## 2019-12-23 ENCOUNTER — Other Ambulatory Visit: Payer: Self-pay

## 2019-12-23 ENCOUNTER — Encounter: Payer: Self-pay | Admitting: Urology

## 2019-12-23 VITALS — BP 130/75 | HR 96 | Temp 98.6°F | Ht 68.0 in | Wt 220.0 lb

## 2019-12-23 DIAGNOSIS — C61 Malignant neoplasm of prostate: Secondary | ICD-10-CM

## 2019-12-23 LAB — MICROSCOPIC EXAMINATION
Bacteria, UA: NONE SEEN
Epithelial Cells (non renal): NONE SEEN /hpf (ref 0–10)
Renal Epithel, UA: NONE SEEN /hpf
WBC, UA: NONE SEEN /hpf (ref 0–5)

## 2019-12-23 LAB — URINALYSIS, ROUTINE W REFLEX MICROSCOPIC
Bilirubin, UA: NEGATIVE
Glucose, UA: NEGATIVE
Ketones, UA: NEGATIVE
Leukocytes,UA: NEGATIVE
Nitrite, UA: NEGATIVE
Protein,UA: NEGATIVE
Specific Gravity, UA: 1.02 (ref 1.005–1.030)
Urobilinogen, Ur: 0.2 mg/dL (ref 0.2–1.0)
pH, UA: 5.5 (ref 5.0–7.5)

## 2019-12-23 MED ORDER — LEUPROLIDE ACETATE (4 MONTH) 30 MG ~~LOC~~ KIT
30.0000 mg | PACK | Freq: Once | SUBCUTANEOUS | Status: AC
  Administered 2019-12-23: 30 mg via SUBCUTANEOUS

## 2019-12-23 NOTE — Progress Notes (Signed)
H&P  Chief Complaint: Prostate Cancer  History of Present Illness:  10.19.2021: Pt completed EBRT on 8.25.2021. He initiated ADT on 4.28.2021 with Mills Koller and received an Eligard injection on 6.8.2021. Pt has experienced hot flashes with ADT. He continues on tamsulosin and wakes 2-3 times per night to void his bladder. He feels he is able to empty his bladder completely. He would like to try to get off of tamsulosin.  IPSS Questionnaire (AUA-7): Over the past month.   1)  How often have you had a sensation of not emptying your bladder completely after you finish urinating?  2 - Less than half the time  2)  How often have you had to urinate again less than two hours after you finished urinating? 3 - About half the time  3)  How often have you found you stopped and started again several times when you urinated?  3 - About half the time  4) How difficult have you found it to postpone urination?  2 - Less than half the time  5) How often have you had a weak urinary stream?  1 - Less than 1 time in 5  6) How often have you had to push or strain to begin urination?  0 - Not at all  7) How many times did you most typically get up to urinate from the time you went to bed until the time you got up in the morning?  2 - 2 times  Total score:  0-7 mildly symptomatic   8-19 moderately symptomatic   20-35 severely symptomatic  QOL score: 2  He underwent ultrasound and biopsy of his prostate on 05/06/2019.  At that time, PSA was 7.5.  There was a left-sided prostate nodule.  Prostate volume was 81 mL.  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 2 cores (left base lateral, left mid lateral) revealed GS 4+3 pattern, 20/50% of cores involved, respectively. 1 core (left apex lateral) revealed GS 4+4 pattern, 40% of core involved  CT abdomen and pelvis revealed no evidence of extraprostatic disease.  There was diverticulosis of the descending and sigmoid colon with subtle stranding along the descending  distal colon suspicious for low-grade diverticulitis.  Suspected diffuse hepatic steatosis.  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  Bilateral renal cysts were noted.  There is a solitary left mid ureteral stone 5 mm in size.  Bone scan revealed no evidence of osseous metastatic disease.  He was referred to [facility] for EBRT..  PA approval dates.  Past Medical History:  Diagnosis Date  . Arthritis    "qwhere" (01/13/2016)  . Cancer [facility])    Prostate  . Diabetes mellitus without complication ([place])   . Fatty liver   . H/O cardiovascular stress test 12/2015  . History of kidney stones    last episode 2-3 yrs. ago, but also remarks that he has had cystocopy & lithotripsy   . Hypertension   . Pre-diabetes     Past Surgical History:  Procedure Laterality Date  . CARPAL TUNNEL RELEASE Bilateral 2003-2004   left-right  . CATARACT EXTRACTION W/PHACO Right 10/29/2014   Procedure: CATARACT EXTRACTION PHACO AND INTRAOCULAR LENS PLACEMENT RIGHT EYE CDE=6.86;  Surgeon: Tonny Branch, MD;  [facility];  Service: Ophthalmology;  Laterality: Right;  . CATARACT EXTRACTION W/PHACO Left 11/26/2014   Procedure: CATARACT EXTRACTION PHACO AND INTRAOCULAR LENS PLACEMENT (IOC);  Surgeon: Tonny Branch, MD;  Location: [facility];  Service: Ophthalmology;  Laterality:  Left;  CDE:5.33  . COLONOSCOPY    . CYSTOSCOPY/RETROGRADE/URETEROSCOPY/STONE EXTRACTION WITH BASKET  X 2  . JOINT REPLACEMENT    . [place]  . LITHOTRIPSY    . TONSILLECTOMY  1958  . TOTAL HIP ARTHROPLASTY Left 2012  . TOTAL SHOULDER ARTHROPLASTY Right 01/13/2016  . TOTAL SHOULDER ARTHROPLASTY Right 01/13/2016   Procedure: RIGHT TOTAL SHOULDER ARTHROPLASTY;  Surgeon: Justice Britain, MD;  Location: [place];  Service: Orthopedics;  Laterality: Right;  . TOTAL SHOULDER ARTHROPLASTY Left 02/01/2017   Procedure: TOTAL SHOULDER ARTHROPLASTY;  Surgeon: Justice Britain, MD;  Location: [place];   Service: Orthopedics;  Laterality: Left;    Home Medications:  Allergies as of 12/23/2019   No Known Allergies     Medication List       Accurate as of December 23, 2019 10:46 AM. If you have any questions, ask your nurse or doctor.        BC HEADACHE POWDER PO Take 1 packet by mouth daily.   CENTRUM SILVER PO Take 1 tablet by mouth every other day.   Cinnamon 500 MG capsule Take 2 capsules by mouth daily.   CO Q 10 PO Take 1 capsule by mouth daily.   FISH OIL PO Take 1 capsule by mouth daily.   lisinopril 5 MG tablet Commonly known as: ZESTRIL Take 5 mg by mouth daily.   metFORMIN 500 MG tablet Commonly known as: GLUCOPHAGE Take 500 mg by mouth 2 (two) times daily.   sulfamethoxazole-trimethoprim 800-160 MG tablet Commonly known as: BACTRIM DS Take 1 tablet by mouth 2 (two) times daily. One the morning of the procedure, on the evening of procedure with food.   SYSTANE OP Place 1 drop into both eyes daily.       Allergies: No Known Allergies  Family History  Problem Relation Age of Onset  . Diabetes Mother   . Heart disease Mother   . Arthritis Mother   . Arthritis Father   . Alcoholism Father     Social History:  reports that he has never smoked. His smokeless tobacco use includes snuff. He reports current alcohol use of about 56.0 standard drinks of alcohol per week. He reports that he does not use drugs.  ROS: A complete review of systems was performed.  All systems are negative except for pertinent findings as noted.  Physical Exam:  Vital signs in last 24 hours: There were no vitals taken for this visit. Constitutional:  Alert and oriented, No acute distress Cardiovascular: Regular rate Respiratory: Normal respiratory effort Neurologic: Grossly intact, no focal deficits Psychiatric: Normal mood and affect  I have reviewed prior pt notes  I have reviewed notes from referring/previous physicians (Dr Adella Nissen)  I have independently reviewed  prior imaging  I have reviewed prior PSA results    Impression/Assessment:  1. PCa--Pt tolerated EBRT well and continues with ADT. He will receive his last injection of Eligard (30 mg) today  Plan:  1. Pt advised to taper off of tamsulosin, starting with a qod regimen and he will discontinue if tolerable.  2. Pt received his final Eligard injection and had his PSA and testosterone drawn today.  3. F/U in 4 months for OV and PSA.  CC: Dr. Judd Lien

## 2019-12-23 NOTE — Progress Notes (Signed)
Urological Symptom Review  Patient is experiencing the following symptoms: Frequent urination Get up at night to urinate Kidney stones  Review of Systems  Gastrointestinal (upper)  : Negative for upper GI symptoms  Gastrointestinal (lower) : Negative for lower GI symptoms  Constitutional : Negative for symptoms  Skin: Negative for skin symptoms  Eyes: Negative for eye symptoms  Ear/Nose/Throat : Sinus problems  Hematologic/Lymphatic: Negative for Hematologic/Lymphatic symptoms  Cardiovascular : Negative for cardiovascular symptoms  Respiratory : Negative for respiratory symptoms  Endocrine: Negative for endocrine symptoms  Musculoskeletal: Back pain Joint pain  Neurological: Negative for neurological symptoms  Psychologic: Negative for psychiatric symptoms

## 2019-12-24 LAB — PSA: Prostate Specific Ag, Serum: 0.1 ng/mL (ref 0.0–4.0)

## 2019-12-24 LAB — TESTOSTERONE: Testosterone: 13 ng/dL — ABNORMAL LOW (ref 264–916)

## 2019-12-25 NOTE — Progress Notes (Signed)
Letter mailed

## 2020-03-01 DIAGNOSIS — E782 Mixed hyperlipidemia: Secondary | ICD-10-CM | POA: Diagnosis not present

## 2020-03-01 DIAGNOSIS — E1169 Type 2 diabetes mellitus with other specified complication: Secondary | ICD-10-CM | POA: Diagnosis not present

## 2020-03-01 DIAGNOSIS — I10 Essential (primary) hypertension: Secondary | ICD-10-CM | POA: Diagnosis not present

## 2020-03-01 DIAGNOSIS — R7989 Other specified abnormal findings of blood chemistry: Secondary | ICD-10-CM | POA: Diagnosis not present

## 2020-03-03 DIAGNOSIS — Z96642 Presence of left artificial hip joint: Secondary | ICD-10-CM | POA: Diagnosis not present

## 2020-03-03 DIAGNOSIS — Z6833 Body mass index (BMI) 33.0-33.9, adult: Secondary | ICD-10-CM | POA: Diagnosis not present

## 2020-03-03 DIAGNOSIS — F17290 Nicotine dependence, other tobacco product, uncomplicated: Secondary | ICD-10-CM | POA: Diagnosis not present

## 2020-03-03 DIAGNOSIS — C61 Malignant neoplasm of prostate: Secondary | ICD-10-CM | POA: Diagnosis not present

## 2020-03-03 DIAGNOSIS — N200 Calculus of kidney: Secondary | ICD-10-CM | POA: Diagnosis not present

## 2020-03-03 DIAGNOSIS — Z79899 Other long term (current) drug therapy: Secondary | ICD-10-CM | POA: Diagnosis not present

## 2020-03-03 DIAGNOSIS — Z7984 Long term (current) use of oral hypoglycemic drugs: Secondary | ICD-10-CM | POA: Diagnosis not present

## 2020-03-03 DIAGNOSIS — K5792 Diverticulitis of intestine, part unspecified, without perforation or abscess without bleeding: Secondary | ICD-10-CM | POA: Diagnosis not present

## 2020-03-14 DIAGNOSIS — G4730 Sleep apnea, unspecified: Secondary | ICD-10-CM | POA: Diagnosis not present

## 2020-03-14 DIAGNOSIS — G4709 Other insomnia: Secondary | ICD-10-CM | POA: Diagnosis not present

## 2020-04-20 ENCOUNTER — Encounter: Payer: Self-pay | Admitting: Urology

## 2020-04-20 ENCOUNTER — Ambulatory Visit (INDEPENDENT_AMBULATORY_CARE_PROVIDER_SITE_OTHER): Payer: Medicare Other | Admitting: Urology

## 2020-04-20 ENCOUNTER — Other Ambulatory Visit: Payer: Self-pay

## 2020-04-20 VITALS — BP 116/72 | HR 79 | Temp 97.7°F

## 2020-04-20 DIAGNOSIS — C61 Malignant neoplasm of prostate: Secondary | ICD-10-CM | POA: Diagnosis not present

## 2020-04-20 LAB — URINALYSIS, ROUTINE W REFLEX MICROSCOPIC
Bilirubin, UA: NEGATIVE
Glucose, UA: NEGATIVE
Ketones, UA: NEGATIVE
Leukocytes,UA: NEGATIVE
Nitrite, UA: NEGATIVE
Protein,UA: NEGATIVE
RBC, UA: NEGATIVE
Specific Gravity, UA: 1.025 (ref 1.005–1.030)
Urobilinogen, Ur: 0.2 mg/dL (ref 0.2–1.0)
pH, UA: 5.5 (ref 5.0–7.5)

## 2020-04-20 NOTE — Progress Notes (Signed)
Urological Symptom Review  Patient is experiencing the following symptoms: Frequent urination Get up at night to urinate   Review of Systems  Gastrointestinal (upper)  : Negative for upper GI symptoms  Gastrointestinal (lower) : Negative for lower GI symptoms  Constitutional : Fatigue  Skin: Negative for skin symptoms  Eyes: Negative for eye symptoms  Ear/Nose/Throat : Sinus problems  Hematologic/Lymphatic: Negative for Hematologic/Lymphatic symptoms  Cardiovascular : Negative for cardiovascular symptoms  Respiratory : Negative for respiratory symptoms  Endocrine: Negative for endocrine symptoms  Musculoskeletal: Back pain Joint pain  Neurological: Negative for neurological symptoms  Psychologic: Negative for psychiatric symptoms

## 2020-04-20 NOTE — Progress Notes (Signed)
History of Present Illness:   Douglas Rasmussen underwent ultrasound and biopsy of his prostate on 05/06/2019. At that time, PSA was 7.5. There was a left-sided prostate nodule. Prostate volume was 81 mL.  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 2 cores (left base lateral, left mid lateral) revealed GS 4+3 pattern, 20/50% of cores involved, respectively. 1 core (left apex lateral) revealed GS 4+4 pattern, 40% of core involved  CT abdomen and pelvis revealed no evidence of extraprostatic disease. There was diverticulosis of the descending and sigmoid colon with subtle stranding along the descending distal colon suspicious for low-grade diverticulitis. Suspected diffuse hepatic steatosis. 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 Bilateral renal cysts were noted. There is a solitary left mid ureteral stone 5 mm in size.  Bone scan revealed no evidence of osseous metastatic disease.  Douglas Rasmussen was referred to [facility] for EBRT..  PA approval dates.  10.19.2021: Pt completed EBRT on 8.25.2021. Douglas Rasmussen initiated ADT on 4.28.2021 with Mills Koller and received an Eligard injection on 6.8.2021. Pt has experienced hot flashes with ADT. Douglas Rasmussen continues on tamsulosin and wakes 2-3 times per night to void his bladder. Douglas Rasmussen feels Douglas Rasmussen is able to empty his bladder completely. Douglas Rasmussen would like to try to get off of tamsulosin.  2.15.2022: Douglas Rasmussen is here today for f/u of his PCa. Douglas Rasmussen has finished his ADT and EBRT therapy at this point and is recovering well. Douglas Rasmussen is experiencing hot-flashes w/ reduced frequency. Douglas Rasmussen continues on tamsulosin and notes some increased urinary frequency. Douglas Rasmussen denies any gross hematuria or significantly worsened LUTS.  IPSS Questionnaire (AUA-7): Over the past month.   1)  How often have you had a sensation of not emptying your bladder completely after you finish urinating?  0 - Not at all  2)  How often have you had to urinate again less than two hours after you  finished urinating? 1 - Less than 1 time in 5  3)  How often have you found you stopped and started again several times when you urinated?  1 - Less than 1 time in 5  4) How difficult have you found it to postpone urination?  1 - Less than 1 time in 5  5) How often have you had a weak urinary stream?  1 - Less than 1 time in 5  6) How often have you had to push or strain to begin urination?  0 - Not at all  7) How many times did you most typically get up to urinate from the time you went to bed until the time you got up in the morning?  2 - 2 times  Total score:  0-7 mildly symptomatic   8-19 moderately symptomatic   20-35 severely symptomatic  IPSS: 6 QoL Score: 2  Past Medical History:  Diagnosis Date  . Arthritis    "qwhere" (01/13/2016)  . Cancer [facility])    Prostate  . Diabetes mellitus without complication ([place])   . Fatty liver   . H/O cardiovascular stress test 12/2015  . History of kidney stones    last episode 2-3 yrs. ago, but also remarks that Douglas Rasmussen has had cystocopy & lithotripsy   . Hypertension   . Pre-diabetes     Past Surgical History:  Procedure Laterality Date  . CARPAL TUNNEL RELEASE Bilateral 2003-2004   left-right  . CATARACT EXTRACTION W/PHACO Right 10/29/2014   Procedure: CATARACT EXTRACTION PHACO AND INTRAOCULAR LENS PLACEMENT RIGHT EYE CDE=6.86;  Surgeon: Levada Dy  Geoffry Paradise, MD;  Location: [facility];  Service: Ophthalmology;  Laterality: Right;  . CATARACT EXTRACTION W/PHACO Left 11/26/2014   Procedure: CATARACT EXTRACTION PHACO AND INTRAOCULAR LENS PLACEMENT (IOC);  Surgeon: Tonny Branch, MD;  Location: [facility];  Service: Ophthalmology;  Laterality: Left;  CDE:5.33  . COLONOSCOPY    . CYSTOSCOPY/RETROGRADE/URETEROSCOPY/STONE EXTRACTION WITH BASKET  X 2  . JOINT REPLACEMENT    . [place]  . LITHOTRIPSY    . TONSILLECTOMY  1958  . TOTAL HIP ARTHROPLASTY Left 2012  . TOTAL SHOULDER ARTHROPLASTY Right 01/13/2016  . TOTAL SHOULDER ARTHROPLASTY Right  01/13/2016   Procedure: RIGHT TOTAL SHOULDER ARTHROPLASTY;  Surgeon: Justice Britain, MD;  Location: [place];  Service: Orthopedics;  Laterality: Right;  . TOTAL SHOULDER ARTHROPLASTY Left 02/01/2017   Procedure: TOTAL SHOULDER ARTHROPLASTY;  Surgeon: Justice Britain, MD;  Location: [place];  Service: Orthopedics;  Laterality: Left;    Home Medications:  Allergies as of 04/20/2020   No Known Allergies     Medication List       Accurate as of April 20, 2020 11:53 AM. If you have any questions, ask your nurse or doctor.        atorvastatin 20 MG tablet Commonly known as: LIPITOR atorvastatin 20 mg tablet   BC HEADACHE POWDER PO Take 1 packet by mouth daily.   CENTRUM SILVER PO Take 1 tablet by mouth every other day.   Cinnamon 500 MG capsule Take 2 capsules by mouth daily.   CO Q 10 PO Take 1 capsule by mouth daily.   FISH OIL PO Take 1 capsule by mouth daily.   lisinopril 5 MG tablet Commonly known as: ZESTRIL Take 5 mg by mouth daily.   metFORMIN 500 MG tablet Commonly known as: GLUCOPHAGE Take 500 mg by mouth 2 (two) times daily.   sulfamethoxazole-trimethoprim 800-160 MG tablet Commonly known as: BACTRIM DS Take 1 tablet by mouth 2 (two) times daily. One the morning of the procedure, on the evening of procedure with food.   SYSTANE OP Place 1 drop into both eyes daily.   tamsulosin 0.4 MG Caps capsule Commonly known as: FLOMAX Take by mouth.       Allergies: No Known Allergies  Family History  Problem Relation Age of Onset  . Diabetes Mother   . Heart disease Mother   . Arthritis Mother   . Arthritis Father   . Alcoholism Father     Social History:  reports that Douglas Rasmussen has never smoked. His smokeless tobacco use includes snuff. Douglas Rasmussen reports current alcohol use of about 56.0 standard drinks of alcohol per week. Douglas Rasmussen reports that Douglas Rasmussen does not use drugs.  ROS: A complete review of systems was performed.  All systems are negative except for pertinent findings as  noted.  Physical Exam:  Vital signs in last 24 hours: BP 116/72   Pulse 79   Temp 97.7 F (36.5 C)  Constitutional:  Alert and oriented, No acute distress Cardiovascular: Regular rate  GI: Abdomen is soft, nontender, nondistended, no abdominal masses. No CVAT. No hernias. Genitourinary: Normal male phallus, testes are descended bilaterally and non-tender and without masses, scrotum is normal in appearance without lesions or masses, perineum is normal on inspection. Prostate feels smooth and flat. Neurologic: Grossly intact, no focal deficits Psychiatric: Normal mood and affect  I have reviewed prior pt notes  I have reviewed notes from referring/previous physicians  I have reviewed urinalysis results  I have independently reviewed prior imaging  I have reviewed prior PSA/pathology results    Impression/Assessment:  PCa -Douglas Rasmussen has completed EBRT/short-term ADT.  No recent PSA.  Douglas Rasmussen seems to be doing well  Plan:  1. Pt PSA and Testosterone pending for today.  2. F/U in 4 months for OV, PSA, Testosterone, and symptom recheck.  CC: Drs. Bigelow

## 2020-04-21 LAB — PSA: Prostate Specific Ag, Serum: <0.1 ng/mL (ref 0.0–4.0)

## 2020-04-21 LAB — TESTOSTERONE: Testosterone: 156 ng/dL — ABNORMAL LOW (ref 264–916)

## 2020-04-23 ENCOUNTER — Telehealth: Payer: Self-pay

## 2020-04-23 NOTE — Telephone Encounter (Signed)
-----   Message from Franchot Gallo, MD sent at 04/22/2020  6:33 PM EST ----- Notify pt psa 0 ----- Message ----- From: Iris Pert, LPN Sent: 3/36/1224   8:21 AM EST To: Franchot Gallo, MD  Please review

## 2020-04-23 NOTE — Telephone Encounter (Signed)
Pt notified of PSA and Testosterone level results.

## 2020-04-23 NOTE — Telephone Encounter (Signed)
-----   Message from Franchot Gallo, MD sent at 04/22/2020  6:33 PM EST ----- Notify pt psa 0 ----- Message ----- From: Iris Pert, LPN Sent: 7/84/7841   8:21 AM EST To: Franchot Gallo, MD  Please review

## 2020-04-23 NOTE — Telephone Encounter (Signed)
-----   Message from Franchot Gallo, MD sent at 04/22/2020  6:34 PM EST ----- Notify pt that T level is getting higher--give #--good news ----- Message ----- From: Iris Pert, LPN Sent: 05/26/5670   1:04 PM EST To: Franchot Gallo, MD  Please review

## 2020-04-23 NOTE — Telephone Encounter (Signed)
Pt notified of results

## 2020-05-07 DIAGNOSIS — M1611 Unilateral primary osteoarthritis, right hip: Secondary | ICD-10-CM | POA: Diagnosis not present

## 2020-05-19 DIAGNOSIS — M25551 Pain in right hip: Secondary | ICD-10-CM | POA: Diagnosis not present

## 2020-06-01 DIAGNOSIS — Z08 Encounter for follow-up examination after completed treatment for malignant neoplasm: Secondary | ICD-10-CM | POA: Diagnosis not present

## 2020-06-01 DIAGNOSIS — N529 Male erectile dysfunction, unspecified: Secondary | ICD-10-CM | POA: Diagnosis not present

## 2020-06-01 DIAGNOSIS — C61 Malignant neoplasm of prostate: Secondary | ICD-10-CM | POA: Diagnosis not present

## 2020-06-01 DIAGNOSIS — M1611 Unilateral primary osteoarthritis, right hip: Secondary | ICD-10-CM | POA: Diagnosis not present

## 2020-06-01 DIAGNOSIS — Z8546 Personal history of malignant neoplasm of prostate: Secondary | ICD-10-CM | POA: Diagnosis not present

## 2020-06-01 DIAGNOSIS — R351 Nocturia: Secondary | ICD-10-CM | POA: Diagnosis not present

## 2020-06-01 DIAGNOSIS — R5383 Other fatigue: Secondary | ICD-10-CM | POA: Diagnosis not present

## 2020-06-01 DIAGNOSIS — Z6832 Body mass index (BMI) 32.0-32.9, adult: Secondary | ICD-10-CM | POA: Diagnosis not present

## 2020-06-08 DIAGNOSIS — K760 Fatty (change of) liver, not elsewhere classified: Secondary | ICD-10-CM | POA: Diagnosis not present

## 2020-06-08 DIAGNOSIS — C61 Malignant neoplasm of prostate: Secondary | ICD-10-CM | POA: Diagnosis not present

## 2020-06-08 DIAGNOSIS — I10 Essential (primary) hypertension: Secondary | ICD-10-CM | POA: Diagnosis not present

## 2020-06-08 DIAGNOSIS — Z01818 Encounter for other preprocedural examination: Secondary | ICD-10-CM | POA: Diagnosis not present

## 2020-06-08 DIAGNOSIS — E1169 Type 2 diabetes mellitus with other specified complication: Secondary | ICD-10-CM | POA: Diagnosis not present

## 2020-06-08 DIAGNOSIS — D126 Benign neoplasm of colon, unspecified: Secondary | ICD-10-CM | POA: Diagnosis not present

## 2020-06-08 DIAGNOSIS — E7849 Other hyperlipidemia: Secondary | ICD-10-CM | POA: Diagnosis not present

## 2020-06-08 DIAGNOSIS — R7989 Other specified abnormal findings of blood chemistry: Secondary | ICD-10-CM | POA: Diagnosis not present

## 2020-06-09 DIAGNOSIS — M25551 Pain in right hip: Secondary | ICD-10-CM | POA: Diagnosis not present

## 2020-06-15 DIAGNOSIS — I10 Essential (primary) hypertension: Secondary | ICD-10-CM | POA: Diagnosis not present

## 2020-06-15 DIAGNOSIS — E1369 Other specified diabetes mellitus with other specified complication: Secondary | ICD-10-CM | POA: Diagnosis not present

## 2020-06-15 DIAGNOSIS — Z01810 Encounter for preprocedural cardiovascular examination: Secondary | ICD-10-CM | POA: Diagnosis not present

## 2020-06-15 DIAGNOSIS — E1344 Other specified diabetes mellitus with diabetic amyotrophy: Secondary | ICD-10-CM | POA: Diagnosis not present

## 2020-06-15 DIAGNOSIS — Z794 Long term (current) use of insulin: Secondary | ICD-10-CM | POA: Diagnosis not present

## 2020-06-15 DIAGNOSIS — Z01818 Encounter for other preprocedural examination: Secondary | ICD-10-CM | POA: Diagnosis not present

## 2020-07-01 DIAGNOSIS — M1612 Unilateral primary osteoarthritis, left hip: Secondary | ICD-10-CM | POA: Diagnosis not present

## 2020-07-05 DIAGNOSIS — I4891 Unspecified atrial fibrillation: Secondary | ICD-10-CM | POA: Diagnosis not present

## 2020-07-14 DIAGNOSIS — T1511XA Foreign body in conjunctival sac, right eye, initial encounter: Secondary | ICD-10-CM | POA: Diagnosis not present

## 2020-08-17 NOTE — H&P (Addendum)
TOTAL HIP ADMISSION H&P  Patient is admitted for right total hip arthroplasty.  Subjective:  Chief Complaint: Right hip pain  HPI: Douglas Rasmussen, 75 y.o. male, has a history of pain and functional disability in the right hip due to arthritis and patient has failed non-surgical conservative treatments for greater than 12 weeks to include NSAID's and/or analgesics and activity modification. Onset of symptoms was gradual, starting  several  years ago with gradually worsening course since that time. The patient noted no past surgery on the right hip. Patient currently rates pain in the right hip at 10 out of 10 with activity. Patient has night pain, worsening of pain with activity and weight bearing, pain that interfers with activities of daily living, and pain with passive range of motion. Patient has evidence of  bone-on-bone inferiorly with a large osteophyte, as well as centrally and is near bone-on-bone superiorly  by imaging studies. This condition presents safety issues increasing the risk of falls.There is no current active infection.  Patient Active Problem List   Diagnosis Date Noted   History of colonic polyps 07/16/2017   Status post total shoulder arthroplasty 02/01/2017   S/P shoulder replacement, right 01/13/2016    Past Medical History:  Diagnosis Date   Arthritis    "qwhere" (01/13/2016)   Cancer ([place])    Prostate   Diabetes mellitus without complication ([place])    Fatty liver    H/O cardiovascular stress test 12/2015   History of kidney stones    last episode 2-3 yrs. ago, but also remarks that he has had cystocopy & lithotripsy    Hypertension    Pre-diabetes     Past Surgical History:  Procedure Laterality Date   CARPAL TUNNEL RELEASE Bilateral 2003-2004   left-right   CATARACT EXTRACTION W/PHACO Right 10/29/2014   Procedure: CATARACT EXTRACTION PHACO AND INTRAOCULAR LENS PLACEMENT RIGHT EYE CDE=6.86;  Surgeon: Tonny Branch, MD;  Location: [facility];  Service:  Ophthalmology;  Laterality: Right;   CATARACT EXTRACTION W/PHACO Left 11/26/2014   Procedure: CATARACT EXTRACTION PHACO AND INTRAOCULAR LENS PLACEMENT (IOC);  Surgeon: Tonny Branch, MD;  Location: [facility];  Service: Ophthalmology;  Laterality: Left;  CDE:5.33   COLONOSCOPY     CYSTOSCOPY/RETROGRADE/URETEROSCOPY/STONE EXTRACTION WITH BASKET  X 2   JOINT REPLACEMENT     KIDNEY STONE SURGERY  1984   LITHOTRIPSY     TONSILLECTOMY  1958   TOTAL HIP ARTHROPLASTY Left 2012   TOTAL SHOULDER ARTHROPLASTY Right 01/13/2016   TOTAL SHOULDER ARTHROPLASTY Right 01/13/2016   Procedure: RIGHT TOTAL SHOULDER ARTHROPLASTY;  Surgeon: Justice Britain, MD;  Location: [place];  Service: Orthopedics;  Laterality: Right;   TOTAL SHOULDER ARTHROPLASTY Left 02/01/2017   Procedure: TOTAL SHOULDER ARTHROPLASTY;  Surgeon: Justice Britain, MD;  Location: [place];  Service: Orthopedics;  Laterality: Left;    Prior to Admission medications   Medication Sig Start Date End Date Taking? Authorizing Provider  Aspirin-Salicylamide-Caffeine (BC HEADACHE POWDER PO) Take 1 packet by mouth daily.    [provider]  atorvastatin (LIPITOR) 20 MG tablet atorvastatin 20 mg tablet    [provider]  Cinnamon 500 MG capsule Take 2 capsules by mouth daily.     [provider]  Coenzyme Q10 (CO Q 10 PO) Take 1 capsule by mouth daily.     [provider]  lisinopril (PRINIVIL,ZESTRIL) 5 MG tablet Take 5 mg by mouth daily.    [provider]  metFORMIN (GLUCOPHAGE) 500 MG tablet Take 500 mg by  mouth 2 (two) times daily. 03/04/19   [provider]  Multiple Vitamins-Minerals (CENTRUM SILVER PO) Take 1 tablet by mouth every other day.     [provider]  Omega-3 Fatty Acids (FISH OIL PO) Take 1 capsule by mouth daily.    [provider]  Polyethyl Glycol-Propyl Glycol (SYSTANE OP) Place 1 drop into both eyes daily.    [provider]  sulfamethoxazole-trimethoprim  (BACTRIM DS) 800-160 MG tablet Take 1 tablet by mouth 2 (two) times daily. One the morning of the procedure, on the evening of procedure with food. 08/19/19   Franchot Gallo, MD  tamsulosin [facility]) 0.4 MG CAPS capsule Take by mouth. 09/30/19 09/29/20  [provider]    No Known Allergies  Social History   Socioeconomic History   Marital status: Married    Spouse name: Not on file   Number of children: Not on file   Years of education: Not on file   Highest education level: Not on file  Occupational History   Not on file  Tobacco Use   Smoking status: Never   Smokeless tobacco: Current    Types: Snuff  Vaping Use   Vaping Use: Never used  Substance and Sexual Activity   Alcohol use: Yes    Alcohol/week: 56.0 standard drinks    Types: 56 Cans of beer per week    Comment: 01/13/2016 average 8/day"   Drug use: No   Sexual activity: Not on file  Other Topics Concern   Not on file  Social History Narrative   Not on file   Social Determinants of Health   Financial Resource Strain: Not on file  Food Insecurity: Not on file  Transportation Needs: Not on file  Physical Activity: Not on file  Stress: Not on file  Social Connections: Not on file  Intimate Partner Violence: Not on file    Tobacco Use: High Risk   Smoking Tobacco Use: Never   Smokeless Tobacco Use: Current   Social History   Substance and Sexual Activity  Alcohol Use Yes   Alcohol/week: 56.0 standard drinks   Types: 56 Cans of beer per week   Comment: 01/13/2016 average 8/day"    Family History  Problem Relation Age of Onset   Diabetes Mother    Heart disease Mother    Arthritis Mother    Arthritis Father    Alcoholism Father     Review of Systems  Constitutional:  Negative for chills and fever.  HENT:  Negative for congestion, sore throat and tinnitus.   Eyes:  Negative for double vision, photophobia and pain.  Respiratory:  Negative for cough, shortness of breath and wheezing.    Cardiovascular:  Negative for chest pain, palpitations and orthopnea.  Gastrointestinal:  Negative for heartburn, nausea and vomiting.  Genitourinary:  Negative for dysuria, frequency and urgency.  Musculoskeletal:  Positive for joint pain.  Neurological:  Negative for dizziness, weakness and headaches.    Objective:  Physical Exam: Well nourished and well developed.  General: Alert and oriented x3, cooperative and pleasant, no acute distress.  Head: normocephalic, atraumatic, neck supple.  Eyes: EOMI.  Respiratory: breath sounds clear in all fields, no wheezing, rales, or rhonchi. Cardiovascular: Regular rate and rhythm, no murmurs, gallops or rubs.  Abdomen: non-tender to palpation and soft, normoactive bowel sounds. Musculoskeletal:  Right Hip Exam:  ROM: Flexion to 100, Internal Rotation 0, External Rotation 20, and abduction 20 without discomfort.  There is no tenderness over the greater trochanter.  Calves soft and nontender. Motor function intact in LE. Strength 5/5 LE bilaterally. Neuro: Distal pulses 2+. Sensation to light touch intact in LE.  Imaging Review Plain radiographs demonstrate severe degenerative joint disease of the right hip. The bone quality appears to be adequate for age and reported activity level.  Assessment/Plan:  End stage arthritis, right hip  The patient history, physical examination, clinical judgement of the provider and imaging studies are consistent with end stage degenerative joint disease of the right hip and total hip arthroplasty is deemed medically necessary. The treatment options including medical management, injection therapy, arthroscopy and arthroplasty were discussed at length. The risks and benefits of total hip arthroplasty were presented and reviewed. The risks due to aseptic loosening, infection, stiffness, dislocation/subluxation, thromboembolic complications and other imponderables were discussed. The patient acknowledged the  explanation, agreed to proceed with the plan and consent was signed. Patient is being admitted for inpatient treatment for surgery, pain control, PT, OT, prophylactic antibiotics, VTE prophylaxis, progressive ambulation and ADLs and discharge planning.The patient is planning to be discharged  home .   Patient's anticipated LOS is less than 2 midnights, meeting these requirements: - Younger than 65 - Lives within 1 hour of care - Has a competent adult at home to recover with post-op recover - NO history of  - Chronic pain requiring opiods  - Diabetes  - Coronary Artery Disease  - Heart failure  - Heart attack  - Stroke  - DVT/VTE  - Cardiac arrhythmia  - Respiratory Failure/COPD  - Renal failure  - Anemia  - Advanced Liver disease  Therapy Plans: HEP Disposition: Home with wife Planned DVT Prophylaxis: Xarelto 10 mg QD DME Needed: None PCP: Judd Lien, MD (clearance received) TXA: IV Allergies: NKDA Anesthesia Concerns: None BMI: 34.6 Last HgbA1c: 7.1% (rechecking with preop labs) [facility]acy: [facility] ([place])  Other: - DM, HTN, hx prostate CA, negative stress test 06/2020 - Currently taking 50-100 mg tramadol BID through [place]  - Patient was instructed on what medications to stop prior to surgery. - Follow-up visit in 2 weeks with Dr. Wynelle Link - Begin physical therapy following surgery - Pre-operative lab work as pre-surgical testing - Prescriptions will be provided in hospital at time of discharge  Theresa Duty, PA-C Orthopedic Surgery [facility] [place]

## 2020-08-20 NOTE — Progress Notes (Signed)
Need orders in epic.  Surgery on 09/01/20.  Preop on 08/24/20.  Thank You.

## 2020-08-24 ENCOUNTER — Encounter (HOSPITAL_COMMUNITY)
Admission: RE | Admit: 2020-08-24 | Discharge: 2020-08-24 | Disposition: A | Discharge status: Home / Self Care | Payer: Medicare Other | Source: Ambulatory Visit | Attending: Orthopedic Surgery | Admitting: Orthopedic Surgery

## 2020-08-24 ENCOUNTER — Encounter (HOSPITAL_COMMUNITY): Payer: Self-pay

## 2020-08-24 ENCOUNTER — Other Ambulatory Visit: Payer: Self-pay

## 2020-08-24 DIAGNOSIS — Z01818 Encounter for other preprocedural examination: Secondary | ICD-10-CM | POA: Diagnosis not present

## 2020-08-24 HISTORY — DX: Sleep apnea, unspecified: G47.30

## 2020-08-24 LAB — CBC
HCT: 43.7 % (ref 39.0–52.0)
Hemoglobin: 13.5 g/dL (ref 13.0–17.0)
MCH: 28.7 pg (ref 26.0–34.0)
MCHC: 30.9 g/dL (ref 30.0–36.0)
MCV: 93 fL (ref 80.0–100.0)
Platelets: 234 10*3/uL (ref 150–400)
RBC: 4.7 MIL/uL (ref 4.22–5.81)
RDW: 13.7 % (ref 11.5–15.5)
WBC: 6.2 10*3/uL (ref 4.0–10.5)
nRBC: 0 % (ref 0.0–0.2)

## 2020-08-24 LAB — COMPREHENSIVE METABOLIC PANEL
ALT: 13 U/L (ref 0–44)
AST: 19 U/L (ref 15–41)
Albumin: 4 g/dL (ref 3.5–5.0)
Alkaline Phosphatase: 68 U/L (ref 38–126)
Anion gap: 9 (ref 5–15)
BUN: 12 mg/dL (ref 8–23)
CO2: 25 mmol/L (ref 22–32)
Calcium: 9.8 mg/dL (ref 8.9–10.3)
Chloride: 104 mmol/L (ref 98–111)
Creatinine, Ser: 0.58 mg/dL — ABNORMAL LOW (ref 0.61–1.24)
GFR, Estimated: >60 mL/min (ref >60)
Glucose, Bld: 121 mg/dL — ABNORMAL HIGH (ref 70–99)
Potassium: 4.5 mmol/L (ref 3.5–5.1)
Sodium: 138 mmol/L (ref 135–145)
Total Bilirubin: 0.7 mg/dL (ref 0.3–1.2)
Total Protein: 7.6 g/dL (ref 6.5–8.1)

## 2020-08-24 LAB — PROTIME-INR
INR: 1 (ref 0.8–1.2)
Prothrombin Time: 13.3 seconds (ref 11.4–15.2)

## 2020-08-24 LAB — SURGICAL PCR SCREEN
MRSA, PCR: NEGATIVE
Staphylococcus aureus: NEGATIVE

## 2020-08-24 LAB — APTT: aPTT: 29 seconds (ref 24–36)

## 2020-08-24 LAB — GLUCOSE, CAPILLARY: Glucose-Capillary: 127 mg/dL — ABNORMAL HIGH (ref 70–99)

## 2020-08-24 NOTE — Progress Notes (Addendum)
Anesthesia Review:  PCP:  DR Dalia Heading- Clearance on chart  dated 07/28/20  Cardiologist : none  Chest x-ray : EKG :Requested from Dr burdine's office on 08/24/20 along with LOV note.  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

## 2020-08-24 NOTE — Progress Notes (Signed)
DUE TO COVID-19 ONLY ONE VISITOR IS ALLOWED TO COME WITH YOU AND STAY IN THE WAITING ROOM ONLY DURING PRE OP AND PROCEDURE DAY OF SURGERY. THE 1 VISITOR  MAY VISIT WITH YOU AFTER SURGERY IN YOUR PRIVATE ROOM DURING VISITING HOURS ONLY!  YOU NEED TO HAVE A COVID 19 TEST ON__6/27/22 _____ @_______ , THIS TEST MUST BE DONE BEFORE SURGERY,  COVID TESTING SITE [address] [place] [address], IT IS ON THE RIGHT GOING OUT [address] APPROXIMATELY  2 MINUTES PAST [employer] ON THE RIGHT. ONCE YOUR COVID TEST IS COMPLETED,  PLEASE BEGIN THE QUARANTINE INSTRUCTIONS AS OUTLINED IN YOUR HANDOUT.                Douglas Rasmussen  08/24/2020   Your procedure is scheduled on:                            09/01/20  Report to [facility] Main  Entrance   Report to admitting at    [place] AM     Call this number if you have problems the morning of surgery [phone]    Remember: Do not eat food , candy gum or mints :After Midnight. You may have clear liquids from midnight until    0545am    CLEAR LIQUID DIET   Foods Allowed                                                                       Coffee and tea, regular and decaf                              Plain Jell-O any favor except red or purple                                            Fruit ices (not with fruit pulp)                                      Iced Popsicles                                     Carbonated beverages, regular and diet                                    Cranberry, grape and apple juices Sports drinks like Gatorade Lightly seasoned clear broth or consume(fat free) Sugar, honey syrup   _____________________________________________________________________    BRUSH YOUR TEETH MORNING OF SURGERY AND RINSE YOUR MOUTH OUT, NO CHEWING GUM CANDY OR MINTS.     Take these medicines the morning of surgery with A SIP OF WATER:           DO NOT TAKE ANY DIABETIC MEDICATIONS DAY OF YOUR SURGERY  You may not have any metal on your body including hair pins and              piercings  Do not wear jewelry, make-up, lotions, powders or perfumes, deodorant             Do not wear nail polish on your fingernails.  Do not shave  48 hours prior to surgery.              Men may shave face and neck.   Do not bring valuables to the hospital. [place].  Contacts, dentures or bridgework may not be worn into surgery.  Leave suitcase in the car. After surgery it may be brought to your room.     Patients discharged the day of surgery will not be allowed to drive home. IF YOU ARE HAVING SURGERY AND GOING HOME THE SAME DAY, YOU MUST HAVE AN ADULT TO DRIVE YOU HOME AND BE WITH YOU FOR 24 HOURS. YOU MAY GO HOME BY TAXI OR UBER OR ORTHERWISE, BUT AN ADULT MUST ACCOMPANY YOU HOME AND STAY WITH YOU FOR 24 HOURS.  Name and phone number of your driver:  Special Instructions: N/A              Please read over the following fact sheets you were given: _____________________________________________________________________  [facility] - Preparing for Surgery Before surgery, you can play an important role.  Because skin is not sterile, your skin needs to be as free of germs as possible.  You can reduce the number of germs on your skin by washing with CHG (chlorahexidine gluconate) soap before surgery.  CHG is an antiseptic cleaner which kills germs and bonds with the skin to continue killing germs even after washing. Please DO NOT use if you have an allergy to CHG or antibacterial soaps.  If your skin becomes reddened/irritated stop using the CHG and inform your nurse when you arrive at Short Stay. Do not shave (including legs and underarms) for at least 48 hours prior to the first CHG shower.  You may shave your face/neck. Please follow these instructions carefully:  1.  Shower with CHG Soap the night before surgery and the  morning of  Surgery.  2.  If you choose to wash your hair, wash your hair first as usual with your  normal  shampoo.  3.  After you shampoo, rinse your hair and body thoroughly to remove the  shampoo.                           4.  Use CHG as you would any other liquid soap.  You can apply chg directly  to the skin and wash                       Gently with a scrungie or clean washcloth.  5.  Apply the CHG Soap to your body ONLY FROM THE NECK DOWN.   Do not use on face/ open                           Wound or open sores. Avoid contact with eyes, ears mouth and genitals (private parts).  Wash face,  Genitals (private parts) with your normal soap.             6.  Wash thoroughly, paying special attention to the area where your surgery  will be performed.  7.  Thoroughly rinse your body with warm water from the neck down.  8.  DO NOT shower/wash with your normal soap after using and rinsing off  the CHG Soap.                9.  Pat yourself dry with a clean towel.            10.  Wear clean pajamas.            11.  Place clean sheets on your bed the night of your first shower and do not  sleep with pets. Day of Surgery : Do not apply any lotions/deodorants the morning of surgery.  Please wear clean clothes to the hospital/surgery center.  FAILURE TO FOLLOW THESE INSTRUCTIONS MAY RESULT IN THE CANCELLATION OF YOUR SURGERY PATIENT SIGNATURE_________________________________  NURSE SIGNATURE__________________________________  ________________________________________________________________________

## 2020-08-25 LAB — HEMOGLOBIN A1C
Hgb A1c MFr Bld: 7.1 % — ABNORMAL HIGH (ref 4.8–5.6)
Mean Plasma Glucose: 157 mg/dL

## 2020-08-30 ENCOUNTER — Other Ambulatory Visit: Payer: Medicare Other

## 2020-08-30 ENCOUNTER — Other Ambulatory Visit (HOSPITAL_COMMUNITY)
Admission: RE | Admit: 2020-08-30 | Discharge: 2020-08-30 | Disposition: A | Discharge status: Home / Self Care | Payer: Medicare Other | Source: Ambulatory Visit | Attending: Orthopedic Surgery | Admitting: Orthopedic Surgery

## 2020-08-30 DIAGNOSIS — Z01812 Encounter for preprocedural laboratory examination: Secondary | ICD-10-CM | POA: Insufficient documentation

## 2020-08-30 DIAGNOSIS — Z20822 Contact with and (suspected) exposure to covid-19: Secondary | ICD-10-CM | POA: Insufficient documentation

## 2020-08-30 LAB — SARS CORONAVIRUS 2 (TAT 6-24 HRS): SARS Coronavirus 2: NEGATIVE

## 2020-09-01 ENCOUNTER — Ambulatory Visit (HOSPITAL_COMMUNITY): Payer: Medicare Other | Admitting: Physician Assistant

## 2020-09-01 ENCOUNTER — Ambulatory Visit (HOSPITAL_COMMUNITY): Payer: Medicare Other | Admitting: Certified Registered Nurse Anesthetist

## 2020-09-01 ENCOUNTER — Encounter (HOSPITAL_COMMUNITY)
Admission: RE | Disposition: A | Discharge status: Home / Self Care | Payer: Self-pay | Source: Ambulatory Visit | Attending: Orthopedic Surgery

## 2020-09-01 ENCOUNTER — Ambulatory Visit (HOSPITAL_COMMUNITY): Payer: Medicare Other

## 2020-09-01 ENCOUNTER — Observation Stay (HOSPITAL_COMMUNITY)
Admission: RE | Admit: 2020-09-01 | Discharge: 2020-09-02 | Disposition: A | Discharge status: Home / Self Care | Payer: Medicare Other | Source: Ambulatory Visit | Attending: Orthopedic Surgery | Admitting: Orthopedic Surgery

## 2020-09-01 ENCOUNTER — Observation Stay (HOSPITAL_COMMUNITY): Payer: Medicare Other

## 2020-09-01 ENCOUNTER — Other Ambulatory Visit: Payer: Self-pay

## 2020-09-01 ENCOUNTER — Encounter (HOSPITAL_COMMUNITY): Payer: Self-pay | Admitting: Orthopedic Surgery

## 2020-09-01 DIAGNOSIS — Z96642 Presence of left artificial hip joint: Secondary | ICD-10-CM | POA: Insufficient documentation

## 2020-09-01 DIAGNOSIS — Z96641 Presence of right artificial hip joint: Secondary | ICD-10-CM | POA: Diagnosis not present

## 2020-09-01 DIAGNOSIS — E119 Type 2 diabetes mellitus without complications: Secondary | ICD-10-CM | POA: Diagnosis not present

## 2020-09-01 DIAGNOSIS — Z96611 Presence of right artificial shoulder joint: Secondary | ICD-10-CM | POA: Insufficient documentation

## 2020-09-01 DIAGNOSIS — I10 Essential (primary) hypertension: Secondary | ICD-10-CM | POA: Diagnosis not present

## 2020-09-01 DIAGNOSIS — M1611 Unilateral primary osteoarthritis, right hip: Principal | ICD-10-CM | POA: Insufficient documentation

## 2020-09-01 DIAGNOSIS — Z96612 Presence of left artificial shoulder joint: Secondary | ICD-10-CM | POA: Insufficient documentation

## 2020-09-01 DIAGNOSIS — G4730 Sleep apnea, unspecified: Secondary | ICD-10-CM | POA: Diagnosis not present

## 2020-09-01 DIAGNOSIS — Z96649 Presence of unspecified artificial hip joint: Secondary | ICD-10-CM

## 2020-09-01 DIAGNOSIS — Z8546 Personal history of malignant neoplasm of prostate: Secondary | ICD-10-CM | POA: Insufficient documentation

## 2020-09-01 DIAGNOSIS — Z79899 Other long term (current) drug therapy: Secondary | ICD-10-CM | POA: Insufficient documentation

## 2020-09-01 DIAGNOSIS — Z7984 Long term (current) use of oral hypoglycemic drugs: Secondary | ICD-10-CM | POA: Diagnosis not present

## 2020-09-01 DIAGNOSIS — M169 Osteoarthritis of hip, unspecified: Secondary | ICD-10-CM | POA: Diagnosis present

## 2020-09-01 DIAGNOSIS — Z7982 Long term (current) use of aspirin: Secondary | ICD-10-CM | POA: Diagnosis not present

## 2020-09-01 DIAGNOSIS — Z471 Aftercare following joint replacement surgery: Secondary | ICD-10-CM | POA: Diagnosis not present

## 2020-09-01 DIAGNOSIS — K760 Fatty (change of) liver, not elsewhere classified: Secondary | ICD-10-CM | POA: Diagnosis not present

## 2020-09-01 DIAGNOSIS — Z419 Encounter for procedure for purposes other than remedying health state, unspecified: Secondary | ICD-10-CM

## 2020-09-01 HISTORY — PX: TOTAL HIP ARTHROPLASTY: SHX124

## 2020-09-01 LAB — GLUCOSE, CAPILLARY
Glucose-Capillary: 127 mg/dL — ABNORMAL HIGH (ref 70–99)
Glucose-Capillary: 124 mg/dL — ABNORMAL HIGH (ref 70–99)
Glucose-Capillary: 212 mg/dL — ABNORMAL HIGH (ref 70–99)
Glucose-Capillary: 201 mg/dL — ABNORMAL HIGH (ref 70–99)

## 2020-09-01 LAB — COMPREHENSIVE METABOLIC PANEL
ALT: 11 U/L (ref 0–44)
AST: 16 U/L (ref 15–41)
Albumin: 3.8 g/dL (ref 3.5–5.0)
Alkaline Phosphatase: 54 U/L (ref 38–126)
Anion gap: 5 (ref 5–15)
BUN: 14 mg/dL (ref 8–23)
CO2: 28 mmol/L (ref 22–32)
Calcium: 9 mg/dL (ref 8.9–10.3)
Chloride: 104 mmol/L (ref 98–111)
Creatinine, Ser: 0.63 mg/dL (ref 0.61–1.24)
GFR, Estimated: >60 mL/min (ref >60)
Glucose, Bld: 154 mg/dL — ABNORMAL HIGH (ref 70–99)
Potassium: 4.5 mmol/L (ref 3.5–5.1)
Sodium: 137 mmol/L (ref 135–145)
Total Bilirubin: 0.4 mg/dL (ref 0.3–1.2)
Total Protein: 6.7 g/dL (ref 6.5–8.1)

## 2020-09-01 LAB — CBC
HCT: 38.3 % — ABNORMAL LOW (ref 39.0–52.0)
Hemoglobin: 11.8 g/dL — ABNORMAL LOW (ref 13.0–17.0)
MCH: 29 pg (ref 26.0–34.0)
MCHC: 30.8 g/dL (ref 30.0–36.0)
MCV: 94.1 fL (ref 80.0–100.0)
Platelets: 205 10*3/uL (ref 150–400)
RBC: 4.07 MIL/uL — ABNORMAL LOW (ref 4.22–5.81)
RDW: 13.6 % (ref 11.5–15.5)
WBC: 5.4 10*3/uL (ref 4.0–10.5)
nRBC: 0 % (ref 0.0–0.2)

## 2020-09-01 LAB — TYPE AND SCREEN
ABO/RH(D): A POS
Antibody Screen: NEGATIVE

## 2020-09-01 SURGERY — ARTHROPLASTY, HIP, TOTAL, ANTERIOR APPROACH
Anesthesia: Spinal | Site: Hip | Laterality: Right

## 2020-09-01 MED ORDER — ONDANSETRON HCL 4 MG/2ML IJ SOLN: 4.0000 mg | Freq: Four times a day (QID) | INTRAMUSCULAR | Status: DC | PRN

## 2020-09-01 MED ORDER — LACTATED RINGERS IV SOLN: INTRAVENOUS | Status: DC

## 2020-09-01 MED ORDER — MAGNESIUM CITRATE PO SOLN: 1.0000 | Freq: Once | ORAL | Status: DC | PRN

## 2020-09-01 MED ORDER — DIPHENHYDRAMINE HCL 25 MG PO CAPS: 25.0000 mg | ORAL_CAPSULE | Freq: Every evening | ORAL | Status: DC | PRN

## 2020-09-01 MED ORDER — MORPHINE SULFATE (PF) 2 MG/ML IV SOLN
0.5000 mg | INTRAVENOUS | Status: DC | PRN
  Administered 2020-09-01: 1 mg via INTRAVENOUS
  Filled 2020-09-01: qty 1

## 2020-09-01 MED ORDER — MENTHOL 3 MG MT LOZG: 1.0000 | LOZENGE | OROMUCOSAL | Status: DC | PRN

## 2020-09-01 MED ORDER — CEFAZOLIN SODIUM-DEXTROSE 2-4 GM/100ML-% IV SOLN
2.0000 g | Freq: Four times a day (QID) | INTRAVENOUS | Status: AC
  Administered 2020-09-01 – 2020-09-02 (×2): 2 g via INTRAVENOUS
  Filled 2020-09-01 (×2): qty 100

## 2020-09-01 MED ORDER — ONDANSETRON HCL 4 MG/2ML IJ SOLN: 4.0000 mg | Freq: Once | INTRAMUSCULAR | Status: DC | PRN

## 2020-09-01 MED ORDER — ALBUMIN HUMAN 5 % IV SOLN
INTRAVENOUS | Status: AC
  Filled 2020-09-01: qty 250

## 2020-09-01 MED ORDER — BRIMONIDINE TARTRATE 0.025 % OP SOLN: 1.0000 [drp] | Freq: Every day | OPHTHALMIC | Status: DC | PRN

## 2020-09-01 MED ORDER — TRAMADOL HCL 50 MG PO TABS
50.0000 mg | ORAL_TABLET | Freq: Four times a day (QID) | ORAL | Status: DC | PRN
  Administered 2020-09-01: 100 mg via ORAL
  Filled 2020-09-01: qty 2

## 2020-09-01 MED ORDER — CEFAZOLIN SODIUM-DEXTROSE 2-4 GM/100ML-% IV SOLN
2.0000 g | INTRAVENOUS | Status: AC
  Administered 2020-09-01: 2 g via INTRAVENOUS
  Filled 2020-09-01: qty 100

## 2020-09-01 MED ORDER — ORAL CARE MOUTH RINSE: 15.0000 mL | Freq: Once | OROMUCOSAL | Status: AC

## 2020-09-01 MED ORDER — METHOCARBAMOL 500 MG IVPB - SIMPLE MED
INTRAVENOUS | Status: AC
  Filled 2020-09-01: qty 50

## 2020-09-01 MED ORDER — 0.9 % SODIUM CHLORIDE (POUR BTL) OPTIME
TOPICAL | Status: DC | PRN
  Administered 2020-09-01: 1000 mL

## 2020-09-01 MED ORDER — POVIDONE-IODINE 10 % EX SWAB
2.0000 "application " | Freq: Once | CUTANEOUS | Status: AC
  Administered 2020-09-01: 2 via TOPICAL

## 2020-09-01 MED ORDER — HYDROCODONE-ACETAMINOPHEN 5-325 MG PO TABS
ORAL_TABLET | ORAL | Status: AC
  Filled 2020-09-01: qty 1

## 2020-09-01 MED ORDER — HYDROCODONE-ACETAMINOPHEN 5-325 MG PO TABS
1.0000 | ORAL_TABLET | ORAL | Status: DC | PRN
  Administered 2020-09-01: 1 via ORAL
  Administered 2020-09-01 – 2020-09-02 (×4): 2 via ORAL
  Filled 2020-09-01 (×4): qty 2

## 2020-09-01 MED ORDER — METOCLOPRAMIDE HCL 5 MG/ML IJ SOLN
5.0000 mg | Freq: Three times a day (TID) | INTRAMUSCULAR | Status: DC | PRN
Start: 2020-09-01 — End: 2020-09-02

## 2020-09-01 MED ORDER — DEXAMETHASONE SODIUM PHOSPHATE 10 MG/ML IJ SOLN
8.0000 mg | Freq: Once | INTRAMUSCULAR | Status: AC
  Administered 2020-09-01: 10 mg via INTRAVENOUS

## 2020-09-01 MED ORDER — HYDROMORPHONE HCL 1 MG/ML IJ SOLN
0.2500 mg | INTRAMUSCULAR | Status: DC | PRN
  Administered 2020-09-01 (×2): 0.5 mg via INTRAVENOUS

## 2020-09-01 MED ORDER — BUPIVACAINE HCL (PF) 0.75 % IJ SOLN
INTRAMUSCULAR | Status: DC | PRN
  Administered 2020-09-01: 13.5 mg

## 2020-09-01 MED ORDER — METHOCARBAMOL 500 MG IVPB - SIMPLE MED
500.0000 mg | Freq: Four times a day (QID) | INTRAVENOUS | Status: DC | PRN
  Administered 2020-09-01: 500 mg via INTRAVENOUS
  Filled 2020-09-01: qty 50

## 2020-09-01 MED ORDER — RIVAROXABAN 10 MG PO TABS
10.0000 mg | ORAL_TABLET | Freq: Every day | ORAL | Status: DC
  Administered 2020-09-02: 10 mg via ORAL
  Filled 2020-09-01: qty 1

## 2020-09-01 MED ORDER — ALBUMIN HUMAN 5 % IV SOLN: INTRAVENOUS | Status: DC | PRN

## 2020-09-01 MED ORDER — PROPOFOL 500 MG/50ML IV EMUL
INTRAVENOUS | Status: DC | PRN
  Administered 2020-09-01: 30 mg via INTRAVENOUS
  Administered 2020-09-01: 20 mg via INTRAVENOUS

## 2020-09-01 MED ORDER — DEXAMETHASONE SODIUM PHOSPHATE 10 MG/ML IJ SOLN
10.0000 mg | Freq: Once | INTRAMUSCULAR | Status: AC
  Administered 2020-09-02: 10 mg via INTRAVENOUS
  Filled 2020-09-01: qty 1

## 2020-09-01 MED ORDER — METHOCARBAMOL 500 MG PO TABS
500.0000 mg | ORAL_TABLET | Freq: Four times a day (QID) | ORAL | Status: DC | PRN
  Administered 2020-09-02: 500 mg via ORAL
  Filled 2020-09-01 (×2): qty 1

## 2020-09-01 MED ORDER — ACETAMINOPHEN 10 MG/ML IV SOLN
1000.0000 mg | Freq: Once | INTRAVENOUS | Status: AC
  Administered 2020-09-01: 1000 mg via INTRAVENOUS
  Filled 2020-09-01: qty 100

## 2020-09-01 MED ORDER — DEXAMETHASONE SODIUM PHOSPHATE 10 MG/ML IJ SOLN
INTRAMUSCULAR | Status: AC
  Filled 2020-09-01: qty 1

## 2020-09-01 MED ORDER — BISACODYL 10 MG RE SUPP: 10.0000 mg | Freq: Every day | RECTAL | Status: DC | PRN

## 2020-09-01 MED ORDER — TRANEXAMIC ACID-NACL 1000-0.7 MG/100ML-% IV SOLN
1000.0000 mg | INTRAVENOUS | Status: AC
  Administered 2020-09-01: 1000 mg via INTRAVENOUS
  Filled 2020-09-01: qty 100

## 2020-09-01 MED ORDER — FENTANYL CITRATE (PF) 100 MCG/2ML IJ SOLN
25.0000 ug | INTRAMUSCULAR | Status: DC | PRN
  Administered 2020-09-01: 25 ug via INTRAVENOUS
  Administered 2020-09-01: 50 ug via INTRAVENOUS

## 2020-09-01 MED ORDER — HYDROMORPHONE HCL 1 MG/ML IJ SOLN
INTRAMUSCULAR | Status: AC
  Administered 2020-09-01: 0.5 mg via INTRAVENOUS
  Filled 2020-09-01: qty 1

## 2020-09-01 MED ORDER — BUPIVACAINE-EPINEPHRINE (PF) 0.25% -1:200000 IJ SOLN
INTRAMUSCULAR | Status: DC | PRN
  Administered 2020-09-01: 30 mL

## 2020-09-01 MED ORDER — OXYCODONE HCL 5 MG PO TABS
ORAL_TABLET | ORAL | Status: AC
  Administered 2020-09-01: 5 mg via ORAL
  Filled 2020-09-01: qty 1

## 2020-09-01 MED ORDER — TAMSULOSIN HCL 0.4 MG PO CAPS
0.4000 mg | ORAL_CAPSULE | Freq: Every day | ORAL | Status: DC
  Administered 2020-09-01 – 2020-09-02 (×2): 0.4 mg via ORAL
  Filled 2020-09-01 (×2): qty 1

## 2020-09-01 MED ORDER — CHLORHEXIDINE GLUCONATE 0.12 % MT SOLN
15.0000 mL | Freq: Once | OROMUCOSAL | Status: AC
  Administered 2020-09-01: 15 mL via OROMUCOSAL

## 2020-09-01 MED ORDER — DOCUSATE SODIUM 100 MG PO CAPS
100.0000 mg | ORAL_CAPSULE | Freq: Two times a day (BID) | ORAL | Status: DC
  Administered 2020-09-01 – 2020-09-02 (×2): 100 mg via ORAL
  Filled 2020-09-01 (×2): qty 1

## 2020-09-01 MED ORDER — PROPOFOL 1000 MG/100ML IV EMUL
INTRAVENOUS | Status: AC
  Filled 2020-09-01: qty 100

## 2020-09-01 MED ORDER — ONDANSETRON HCL 4 MG/2ML IJ SOLN
INTRAMUSCULAR | Status: DC | PRN
  Administered 2020-09-01: 4 mg via INTRAVENOUS

## 2020-09-01 MED ORDER — OXYCODONE HCL 5 MG PO TABS
ORAL_TABLET | ORAL | Status: AC
  Filled 2020-09-01: qty 1

## 2020-09-01 MED ORDER — SODIUM CHLORIDE 0.9 % IV SOLN
INTRAVENOUS | Status: DC | PRN
  Administered 2020-09-01: 50 ug/min via INTRAVENOUS

## 2020-09-01 MED ORDER — ACETAMINOPHEN 325 MG PO TABS: 325.0000 mg | ORAL_TABLET | Freq: Four times a day (QID) | ORAL | Status: DC | PRN

## 2020-09-01 MED ORDER — POLYETHYLENE GLYCOL 3350 17 G PO PACK: 17.0000 g | PACK | Freq: Every day | ORAL | Status: DC | PRN

## 2020-09-01 MED ORDER — OXYCODONE HCL 5 MG PO TABS: 5.0000 mg | ORAL_TABLET | Freq: Once | ORAL | Status: AC | PRN

## 2020-09-01 MED ORDER — FENTANYL CITRATE (PF) 100 MCG/2ML IJ SOLN
INTRAMUSCULAR | Status: AC
  Administered 2020-09-01: 25 ug via INTRAVENOUS
  Filled 2020-09-01: qty 2

## 2020-09-01 MED ORDER — SODIUM CHLORIDE 0.9 % IV SOLN: INTRAVENOUS | Status: DC

## 2020-09-01 MED ORDER — ACETAMINOPHEN 10 MG/ML IV SOLN: 1000.0000 mg | Freq: Four times a day (QID) | INTRAVENOUS | Status: DC

## 2020-09-01 MED ORDER — AMISULPRIDE (ANTIEMETIC) 5 MG/2ML IV SOLN: 10.0000 mg | Freq: Once | INTRAVENOUS | Status: DC | PRN

## 2020-09-01 MED ORDER — BUPIVACAINE HCL (PF) 0.75 % IJ SOLN: INTRAMUSCULAR | Status: DC | PRN

## 2020-09-01 MED ORDER — ONDANSETRON HCL 4 MG PO TABS: 4.0000 mg | ORAL_TABLET | Freq: Four times a day (QID) | ORAL | Status: DC | PRN

## 2020-09-01 MED ORDER — OXYCODONE HCL 5 MG/5ML PO SOLN: 5.0000 mg | Freq: Once | ORAL | Status: AC | PRN

## 2020-09-01 MED ORDER — PHENYLEPHRINE HCL (PRESSORS) 10 MG/ML IV SOLN
INTRAVENOUS | Status: AC
  Filled 2020-09-01: qty 1

## 2020-09-01 MED ORDER — PROPOFOL 500 MG/50ML IV EMUL
INTRAVENOUS | Status: DC | PRN
  Administered 2020-09-01: 100 ug/kg/min via INTRAVENOUS

## 2020-09-01 MED ORDER — METOCLOPRAMIDE HCL 5 MG PO TABS: 5.0000 mg | ORAL_TABLET | Freq: Three times a day (TID) | ORAL | Status: DC | PRN

## 2020-09-01 MED ORDER — ONDANSETRON HCL 4 MG/2ML IJ SOLN
INTRAMUSCULAR | Status: AC
  Filled 2020-09-01: qty 2

## 2020-09-01 MED ORDER — BUPIVACAINE-EPINEPHRINE (PF) 0.25% -1:200000 IJ SOLN
INTRAMUSCULAR | Status: AC
  Filled 2020-09-01: qty 30

## 2020-09-01 MED ORDER — PHENOL 1.4 % MT LIQD
1.0000 | OROMUCOSAL | Status: DC | PRN
  Administered 2020-09-02: 1 via OROMUCOSAL

## 2020-09-01 SURGICAL SUPPLY — 45 items
ARTICULEZE HEAD (Hips) ×3 IMPLANT
BAG COUNTER SPONGE SURGICOUNT (BAG) IMPLANT
BAG DECANTER FOR FLEXI CONT (MISCELLANEOUS) IMPLANT
BAG SURGICOUNT SPONGE COUNTING (BAG)
BAG ZIPLOCK 12X15 (MISCELLANEOUS) ×3 IMPLANT
BLADE SAG 18X100X1.27 (BLADE) ×3 IMPLANT
CLOSURE WOUND 1/2 X4 (GAUZE/BANDAGES/DRESSINGS) ×2
COVER PERINEAL POST (MISCELLANEOUS) ×3 IMPLANT
COVER SURGICAL LIGHT HANDLE (MISCELLANEOUS) ×3 IMPLANT
CUP ACETBLR 54 OD PINNACLE (Hips) ×3 IMPLANT
DECANTER SPIKE VIAL GLASS SM (MISCELLANEOUS) ×3 IMPLANT
DRAPE FOOT SWITCH (DRAPES) ×3 IMPLANT
DRAPE STERI IOBAN 125X83 (DRAPES) ×3 IMPLANT
DRAPE U-SHAPE 47X51 STRL (DRAPES) ×6 IMPLANT
DRSG AQUACEL AG ADV 3.5X10 (GAUZE/BANDAGES/DRESSINGS) ×3 IMPLANT
DURAPREP 26ML APPLICATOR (WOUND CARE) ×3 IMPLANT
ELECT REM PT RETURN 15FT ADLT (MISCELLANEOUS) ×3 IMPLANT
GLOVE SRG 8 PF TXTR STRL LF DI (GLOVE) ×1 IMPLANT
GLOVE SURG ENC MOIS LTX SZ6.5 (GLOVE) ×3 IMPLANT
GLOVE SURG ENC MOIS LTX SZ7 (GLOVE) ×3 IMPLANT
GLOVE SURG ENC MOIS LTX SZ8 (GLOVE) ×6 IMPLANT
GLOVE SURG UNDER POLY LF SZ7 (GLOVE) ×3 IMPLANT
GLOVE SURG UNDER POLY LF SZ8 (GLOVE) ×2
GLOVE SURG UNDER POLY LF SZ8.5 (GLOVE) IMPLANT
GOWN STRL REUS W/TWL LRG LVL3 (GOWN DISPOSABLE) ×6 IMPLANT
GOWN STRL REUS W/TWL XL LVL3 (GOWN DISPOSABLE) IMPLANT
HEAD ARTICULEZE (Hips) ×1 IMPLANT
HOLDER FOLEY CATH W/STRAP (MISCELLANEOUS) ×3 IMPLANT
KIT TURNOVER KIT A (KITS) ×3 IMPLANT
LINER NEUTRAL 54X36MM PLUS 4 (Hips) ×3 IMPLANT
MANIFOLD NEPTUNE II (INSTRUMENTS) ×3 IMPLANT
PACK ANTERIOR HIP CUSTOM (KITS) ×3 IMPLANT
PENCIL SMOKE EVACUATOR COATED (MISCELLANEOUS) ×3 IMPLANT
STEM FEMORAL SZ6 HIGH ACTIS (Stem) ×3 IMPLANT
STRIP CLOSURE SKIN 1/2X4 (GAUZE/BANDAGES/DRESSINGS) ×4 IMPLANT
SUT ETHIBOND NAB CT1 #1 30IN (SUTURE) ×3 IMPLANT
SUT MNCRL AB 4-0 PS2 18 (SUTURE) ×3 IMPLANT
SUT STRATAFIX 0 PDS 27 VIOLET (SUTURE) ×3
SUT VIC AB 2-0 CT1 27 (SUTURE) ×4
SUT VIC AB 2-0 CT1 TAPERPNT 27 (SUTURE) ×2 IMPLANT
SUTURE STRATFX 0 PDS 27 VIOLET (SUTURE) ×1 IMPLANT
SYR 50ML LL SCALE MARK (SYRINGE) IMPLANT
TRAY FOLEY MTR SLVR 16FR STAT (SET/KITS/TRAYS/PACK) ×3 IMPLANT
TUBE SUCTION HIGH CAP CLEAR NV (SUCTIONS) ×3 IMPLANT
WATER STERILE IRR 1000ML POUR (IV SOLUTION) ×6 IMPLANT

## 2020-09-01 NOTE — Discharge Instructions (Addendum)
Frank Aluisio, MD Total Joint Specialist [facility] [place] [address] [place], [place] [address] [phone]  ANTERIOR APPROACH TOTAL HIP REPLACEMENT POSTOPERATIVE DIRECTIONS     Hip Rehabilitation, Guidelines Following Surgery  The results of a hip operation are greatly improved after range of motion and muscle strengthening exercises. Follow all safety measures which are given to protect your hip. If any of these exercises cause increased pain or swelling in your joint, decrease the amount until you are comfortable again. Then slowly increase the exercises. Call your caregiver if you have problems or questions.   BLOOD CLOT PREVENTION Take a 10 mg Xarelto once a day for three weeks following surgery. Then take an 81 mg Aspirin once a day for three weeks. Then discontinue Aspirin. You may resume your vitamins/supplements once you have discontinued the Xarelto. Do not take any NSAIDs (Advil, Aleve, Ibuprofen, Meloxicam, etc.) until you have discontinued the Xarelto.   HOME CARE INSTRUCTIONS  Remove items at home which could result in a fall. This includes throw rugs or furniture in walking pathways.  ICE to the affected hip as frequently as 20-30 minutes an hour and then as needed for pain and swelling. Continue to use ice on the hip for pain and swelling from surgery. You may notice swelling that will progress down to the foot and ankle. This is normal after surgery. Elevate the leg when you are not up walking on it.   Continue to use the breathing machine which will help keep your temperature down.  It is common for your temperature to cycle up and down following surgery, especially at night when you are not up moving around and exerting yourself.  The breathing machine keeps your lungs expanded and your temperature down.  DIET You may resume your previous home diet once your are discharged from the hospital.  DRESSING / WOUND CARE / SHOWERING You have an  adhesive waterproof bandage over the incision. Leave this in place until your first follow-up appointment. Once you remove this you will not need to place another bandage.  You may begin showering 3 days following surgery, but do not submerge the incision under water.  ACTIVITY For the first 3-5 days, it is important to rest and keep the operative leg elevated. You should, as a general rule, rest for 50 minutes and walk/stretch for 10 minutes per hour. After 5 days, you may slowly increase activity as tolerated.  Perform the exercises you were provided twice a day for about 15-20 minutes each session. Begin these 2 days following surgery. Walk with your walker as instructed. Use the walker until you are comfortable transitioning to a cane. Walk with the cane in the opposite hand of the operative leg. You may discontinue the cane once you are comfortable and walking steadily. Avoid periods of inactivity such as sitting longer than an hour when not asleep. This helps prevent blood clots.  Do not drive a car for 6 weeks or until released by your surgeon.  Do not drive while taking narcotics.  TED HOSE STOCKINGS Wear the elastic stockings on both legs for three weeks following surgery during the day. You may remove them at night while sleeping.  WEIGHT BEARING Weight bearing as tolerated with assist device (walker, cane, etc) as directed, use it as long as suggested by your surgeon or therapist, typically at least 4-6 weeks.  POSTOPERATIVE CONSTIPATION PROTOCOL Constipation - defined medically as fewer than three stools per week and severe constipation as less   than one stool per week.  One of the most common issues patients have following surgery is constipation.  Even if you have a regular bowel pattern at home, your normal regimen is likely to be disrupted due to multiple reasons following surgery.  Combination of anesthesia, postoperative narcotics, change in appetite and fluid intake all can  affect your bowels.  In order to avoid complications following surgery, here are some recommendations in order to help you during your recovery period.  Colace (docusate) - Pick up an over-the-counter form of Colace or another stool softener and take twice a day as long as you are requiring postoperative pain medications.  Take with a full glass of water daily.  If you experience loose stools or diarrhea, hold the colace until you stool forms back up.  If your symptoms do not get better within 1 week or if they get worse, check with your doctor. Dulcolax (bisacodyl) - Pick up over-the-counter and take as directed by the product packaging as needed to assist with the movement of your bowels.  Take with a full glass of water.  Use this product as needed if not relieved by Colace only.  MiraLax (polyethylene glycol) - Pick up over-the-counter to have on hand.  MiraLax is a solution that will increase the amount of water in your bowels to assist with bowel movements.  Take as directed and can mix with a glass of water, juice, soda, coffee, or tea.  Take if you go more than two days without a movement.Do not use MiraLax more than once per day. Call your doctor if you are still constipated or irregular after using this medication for 7 days in a row.  If you continue to have problems with postoperative constipation, please contact the office for further assistance and recommendations.  If you experience "the worst abdominal pain ever" or develop nausea or vomiting, please contact the office immediatly for further recommendations for treatment.  ITCHING  If you experience itching with your medications, try taking only a single pain pill, or even half a pain pill at a time.  You can also use Benadryl over the counter for itching or also to help with sleep.   MEDICATIONS See your medication summary on the "After Visit Summary" that the nursing staff will review with you prior to discharge.  You may have some home  medications which will be placed on hold until you complete the course of blood thinner medication.  It is important for you to complete the blood thinner medication as prescribed by your surgeon.  Continue your approved medications as instructed at time of discharge.  PRECAUTIONS If you experience chest pain or shortness of breath - call 911 immediately for transfer to the hospital emergency department.  If you develop a fever greater that 101 F, purulent drainage from wound, increased redness or drainage from wound, foul odor from the wound/dressing, or calf pain - CONTACT YOUR SURGEON.                                                   FOLLOW-UP APPOINTMENTS Make sure you keep all of your appointments after your operation with your surgeon and caregivers. You should call the office at the above phone number and make an appointment for approximately two weeks after the date of your surgery or on the date  instructed by your surgeon outlined in the "After Visit Summary".  RANGE OF MOTION AND STRENGTHENING EXERCISES  These exercises are designed to help you keep full movement of your hip joint. Follow your caregiver's or physical therapist's instructions. Perform all exercises about fifteen times, three times per day or as directed. Exercise both hips, even if you have had only one joint replacement. These exercises can be done on a training (exercise) mat, on the floor, on a table or on a bed. Use whatever works the best and is most comfortable for you. Use music or television while you are exercising so that the exercises are a pleasant break in your day. This will make your life better with the exercises acting as a break in routine you can look forward to.  Lying on your back, slowly slide your foot toward your buttocks, raising your knee up off the floor. Then slowly slide your foot back down until your leg is straight again.  Lying on your back spread your legs as far apart as you can without causing  discomfort.  Lying on your side, raise your upper leg and foot straight up from the floor as far as is comfortable. Slowly lower the leg and repeat.  Lying on your back, tighten up the muscle in the front of your thigh (quadriceps muscles). You can do this by keeping your leg straight and trying to raise your heel off the floor. This helps strengthen the largest muscle supporting your knee.  Lying on your back, tighten up the muscles of your buttocks both with the legs straight and with the knee bent at a comfortable angle while keeping your heel on the floor.   POST-OPERATIVE OPIOID TAPER INSTRUCTIONS: It is important to wean off of your opioid medication as soon as possible. If you do not need pain medication after your surgery it is ok to stop day one. Opioids include: Codeine, Hydrocodone(Norco, Vicodin), Oxycodone(Percocet, oxycontin) and hydromorphone amongst others.  Long term and even short term use of opiods can cause: Increased pain response Dependence Constipation Depression Respiratory depression And more.  Withdrawal symptoms can include Flu like symptoms Nausea, vomiting And more Techniques to manage these symptoms Hydrate well Eat regular healthy meals Stay active Use relaxation techniques(deep breathing, meditating, yoga) Do Not substitute Alcohol to help with tapering If you have been on opioids for less than two weeks and do not have pain than it is ok to stop all together.  Plan to wean off of opioids This plan should start within one week post op of your joint replacement. Maintain the same interval or time between taking each dose and first decrease the dose.  Cut the total daily intake of opioids by one tablet each day Next start to increase the time between doses. The last dose that should be eliminated is the evening dose.   IF YOU ARE TRANSFERRED TO A SKILLED REHAB FACILITY If the patient is transferred to a skilled rehab facility following release from the  hospital, a list of the current medications will be sent to the facility for the patient to continue.  When discharged from the skilled rehab facility, please have the facility set up the patient's Home Health Physical Therapy prior to being released. Also, the skilled facility will be responsible for providing the patient with their medications at time of release from the facility to include their pain medication, the muscle relaxants, and their blood thinner medication. If the patient is still at the rehab facility at   time of the two week follow up appointment, the skilled rehab facility will also need to assist the patient in arranging follow up appointment in our office and any transportation needs.  MAKE SURE YOU:  Understand these instructions.  Get help right away if you are not doing well or get worse.    DENTAL ANTIBIOTICS:  In most cases prophylactic antibiotics for Dental procdeures after total joint surgery are not necessary.  Exceptions are as follows:  1. History of prior total joint infection  2. Severely immunocompromised (Organ Transplant, cancer chemotherapy, Rheumatoid biologic meds such as Humera)  3. Poorly controlled diabetes (A1C &gt; 8.0, blood glucose over 200)  If you have one of these conditions, contact your surgeon for an antibiotic prescription, prior to your dental procedure.    Pick up stool softner and laxative for home use following surgery while on pain medications. Do not submerge incision under water. Please use good hand washing techniques while changing dressing each day. May shower starting three days after surgery. Please use a clean towel to pat the incision dry following showers. Continue to use ice for pain and swelling after surgery. Do not use any lotions or creams on the incision until instructed by your surgeon.      Information on my medicine - XARELTO (Rivaroxaban)  Why was Xarelto prescribed for you? Xarelto was prescribed  for you to reduce the risk of blood clots forming after orthopedic surgery. The medical term for these abnormal blood clots is venous thromboembolism (VTE).  What do you need to know about xarelto ? Take your Xarelto ONCE DAILY at the same time every day. You may take it either with or without food.  If you have difficulty swallowing the tablet whole, you may crush it and mix in applesauce just prior to taking your dose.  Take Xarelto exactly as prescribed by your doctor and DO NOT stop taking Xarelto without talking to the doctor who prescribed the medication.  Stopping without other VTE prevention medication to take the place of Xarelto may increase your risk of developing a clot.  After discharge, you should have regular check-up appointments with your healthcare provider that is prescribing your Xarelto.    What do you do if you miss a dose? If you miss a dose, take it as soon as you remember on the same day then continue your regularly scheduled once daily regimen the next day. Do not take two doses of Xarelto on the same day.   Important Safety Information A possible side effect of Xarelto is bleeding. You should call your healthcare provider right away if you experience any of the following: Bleeding from an injury or your nose that does not stop. Unusual colored urine (red or dark brown) or unusual colored stools (red or black). Unusual bruising for unknown reasons. A serious fall or if you hit your head (even if there is no bleeding).  Some medicines may interact with Xarelto and might increase your risk of bleeding while on Xarelto. To help avoid this, consult your healthcare provider or pharmacist prior to using any new prescription or non-prescription medications, including herbals, vitamins, non-steroidal anti-inflammatory drugs (NSAIDs) and supplements.  This website has more information on Xarelto: www.xarelto.com.   

## 2020-09-01 NOTE — Anesthesia Postprocedure Evaluation (Signed)
Anesthesia Post Note  Patient: Douglas Rasmussen  Procedure(s) Performed: TOTAL HIP ARTHROPLASTY ANTERIOR APPROACH (Right: Hip)     Patient location during evaluation: PACU Anesthesia Type: Spinal Level of consciousness: oriented and awake and alert Pain management: pain level controlled Vital Signs Assessment: post-procedure vital signs reviewed and stable Respiratory status: spontaneous breathing, respiratory function stable and nonlabored ventilation Cardiovascular status: blood pressure returned to baseline and stable Postop Assessment: no headache, no backache, no apparent nausea or vomiting and spinal receding Anesthetic complications: no   No notable events documented.  Last Vitals:  Vitals:   09/01/20 1600 09/01/20 1615  BP: 131/83 121/80  Pulse: 91 88  Resp: 16 16  Temp: (!) 36.3 C   SpO2: 97% 99%    Last Pain:  Vitals:   09/01/20 1615  TempSrc:   PainSc: 0-No pain                 Lidia Collum

## 2020-09-01 NOTE — Care Plan (Signed)
Ortho Bundle Case Management Note  Patient Details  Name: Douglas Rasmussen MRN: [id number] Date of Birth: 02-27-1946  R [facility] on 09-01-20 DCP:  Home with wife.  1 story home with 2 ste. DME:  No needs.  Has a RW and 3-in-1 PT:  HEP                   DME Arranged:  N/A DME Agency:  NA  HH Arranged:  NA HH Agency:  NA  Additional Comments: Please contact me with any questions of if this plan should need to change.  Marianne Sofia, RN,CCM [facility]  [phone] 09/01/2020, 3:27 PM

## 2020-09-01 NOTE — Op Note (Signed)
OPERATIVE REPORT- TOTAL HIP ARTHROPLASTY   PREOPERATIVE DIAGNOSIS: Osteoarthritis of the Right hip.   POSTOPERATIVE DIAGNOSIS: Osteoarthritis of the Right  hip.   PROCEDURE: Right total hip arthroplasty, anterior approach.   SURGEON: Gaynelle Arabian, MD   ASSISTANT: Theresa Duty, PA-C  ANESTHESIA:  Spinal  ESTIMATED BLOOD LOSS:-450 mL    DRAINS: Hemovac x1.   COMPLICATIONS: None   CONDITION: PACU - hemodynamically stable.   BRIEF CLINICAL NOTE: Douglas Rasmussen is a 75 y.o. male who has advanced end-  stage arthritis of their Right  hip with progressively worsening pain and  dysfunction.The patient has failed nonoperative management and presents for  total hip arthroplasty.   PROCEDURE IN DETAIL: After successful administration of spinal  anesthetic, the traction boots for the [facility] bed were placed on both  feet and the patient was placed onto the [facility] bed, boots placed into the leg  holders. The Right hip was then isolated from the perineum with plastic  drapes and prepped and draped in the usual sterile fashion. ASIS and  greater trochanter were marked and a oblique incision was made, starting  at about 1 cm lateral and 2 cm distal to the ASIS and coursing towards  the anterior cortex of the femur. The skin was cut with a 10 blade  through subcutaneous tissue to the level of the fascia overlying the  tensor fascia lata muscle. The fascia was then incised in line with the  incision at the junction of the anterior third and posterior 2/3rd. The  muscle was teased off the fascia and then the interval between the TFL  and the rectus was developed. The Hohmann retractor was then placed at  the top of the femoral neck over the capsule. The vessels overlying the  capsule were cauterized and the fat on top of the capsule was removed.  A Hohmann retractor was then placed anterior underneath the rectus  femoris to give exposure to the entire anterior capsule. A T-shaped   capsulotomy was performed. The edges were tagged and the femoral head  was identified.       Osteophytes are removed off the superior acetabulum.  The femoral neck was then cut in situ with an oscillating saw. Traction  was then applied to the left lower extremity utilizing the [facility]  traction. The femoral head was then removed. Retractors were placed  around the acetabulum and then circumferential removal of the labrum was  performed. Osteophytes were also removed. Reaming starts at 49 mm to  medialize and  Increased in 2 mm increments to 53 mm. We reamed in  approximately 40 degrees of abduction, 20 degrees anteversion. A 54 mm  pinnacle acetabular shell was then impacted in anatomic position under  fluoroscopic guidance with excellent purchase. We did not need to place  any additional dome screws. A 36 mm neutral + 4 marathon liner was then  placed into the acetabular shell.       The femoral lift was then placed along the lateral aspect of the femur  just distal to the vastus ridge. The leg was  externally rotated and capsule  was stripped off the inferior aspect of the femoral neck down to the  level of the lesser trochanter, this was done with electrocautery. The femur was lifted after this was performed. The  leg was then placed in an extended and adducted position essentially delivering the femur. We also removed the capsule superiorly and the piriformis from the piriformis  fossa to gain excellent exposure of the  proximal femur. Rongeur was used to remove some cancellous bone to get  into the lateral portion of the proximal femur for placement of the  initial starter reamer. The starter broaches was placed  the starter broach  and was shown to go down the center of the canal. Broaching  with the Actis system was then performed starting at size 0  coursing  Up to size 6. A size 6 had excellent torsional and rotational  and axial stability. The trial high offset neck was then placed   with a 36 + 5 trial head. The hip was then reduced. We confirmed that  the stem was in the canal both on AP and lateral x-rays. It also has excellent sizing. The hip was reduced with outstanding stability through full extension and full external rotation.. AP pelvis was taken and the leg lengths were measured and found to be equal. Hip was then dislocated again and the femoral head and neck removed. The  femoral broach was removed. Size 6 Actis stem with a high offset  neck was then impacted into the femur following native anteversion. Has  excellent purchase in the canal. Excellent torsional and rotational and  axial stability. It is confirmed to be in the canal on AP and lateral  fluoroscopic views. The 36 + 5 metal head was placed and the hip  reduced with outstanding stability. Again AP pelvis was taken and it  confirmed that the leg lengths were equal. The wound was then copiously  irrigated with saline solution and the capsule reattached and repaired  with Ethibond suture. 30 ml of .25% Bupivicaine was  injected into the capsule and into the edge of the tensor fascia lata as well as subcutaneous tissue. The fascia overlying the tensor fascia lata was then closed with a running #1 V-Loc. Subcu was closed with interrupted 2-0 Vicryl and subcuticular running 4-0 Monocryl. Incision was cleaned  and dried. Steri-Strips and a bulky sterile dressing applied. The patient was awakened and transported to  recovery in stable condition.        Please note that a surgical assistant was a medical necessity for this procedure to perform it in a safe and expeditious manner. Assistant was necessary to provide appropriate retraction of vital neurovascular structures and to prevent femoral fracture and allow for anatomic placement of the prosthesis.  Gaynelle Arabian, M.D.

## 2020-09-01 NOTE — Anesthesia Procedure Notes (Signed)
Spinal  Patient location during procedure: OR Start time: 09/01/2020 10:43 AM Reason for block: surgical anesthesia Staffing Performed: resident/CRNA  Preanesthetic Checklist Completed: patient identified, IV checked, site marked, risks and benefits discussed, surgical consent, monitors and equipment checked, pre-op evaluation and timeout performed Spinal Block Patient position: sitting Prep: DuraPrep Patient monitoring: heart rate, cardiac monitor, continuous pulse ox and blood pressure Approach: midline Location: L3-4 Injection technique: single-shot Needle Needle type: Sprotte  Needle gauge: 24 G Needle length: 10 cm Assessment Sensory level: T4 Events: CSF return

## 2020-09-01 NOTE — Interval H&P Note (Signed)
History and Physical Interval Note:  09/01/2020 9:22 AM  Rolene Arbour  has presented today for surgery, with the diagnosis of Right hip osteoarthritis.  The various methods of treatment have been discussed with the patient and family. After consideration of risks, benefits and other options for treatment, the patient has consented to  Procedure(s) with comments: [place] (Right) - 19min as a surgical intervention.  The patient's history has been reviewed, patient examined, no change in status, stable for surgery.  I have reviewed the patient's chart and labs.  Questions were answered to the patient's satisfaction.     Pilar Plate Enes Wegener

## 2020-09-01 NOTE — Transfer of Care (Signed)
Immediate Anesthesia Transfer of Care Note  Patient: NATALIO SALOIS  Procedure(s) Performed: TOTAL HIP ARTHROPLASTY ANTERIOR APPROACH (Right: Hip)  Patient Location: PACU  Anesthesia Type:MAC and Spinal  Level of Consciousness: awake, alert  and oriented  Airway & Oxygen Therapy: Patient Spontanous Breathing  Post-op Assessment: Report given to RN and Post -op Vital signs reviewed and stable  Post vital signs: Reviewed and stable  Last Vitals:  Vitals Value Taken Time  BP 112/65 09/01/20 1225  Temp    Pulse 88 09/01/20 1227  Resp 20 09/01/20 1227  SpO2 94 % 09/01/20 1227  Vitals shown include unvalidated device data.  Last Pain:  Vitals:   09/01/20 0758  TempSrc:   PainSc: 3       Patients Stated Pain Goal: 3 (55/[id number]/41 [id number])  Complications: No notable events documented.

## 2020-09-01 NOTE — Anesthesia Preprocedure Evaluation (Signed)
Anesthesia Evaluation  Patient identified by MRN, date of birth, ID band Patient awake    Reviewed: Allergy & Precautions, NPO status , Patient's Chart, lab work & pertinent test results  History of Anesthesia Complications Negative for: history of anesthetic complications  Airway Mallampati: II  TM Distance: >3 FB Neck ROM: Full    Dental  (+) Teeth Intact   Pulmonary sleep apnea ,    Pulmonary exam normal        Cardiovascular hypertension, Pt. on medications Normal cardiovascular exam     Neuro/Psych negative neurological ROS     GI/Hepatic negative GI ROS, Neg liver ROS,   Endo/Other  diabetes, Oral Hypoglycemic Agents  Renal/GU negative Renal ROS  negative genitourinary   Musculoskeletal  (+) Arthritis ,   Abdominal   Peds  Hematology negative hematology ROS (+)   Anesthesia Other Findings   Reproductive/Obstetrics                            Anesthesia Physical Anesthesia Plan  ASA: 2  Anesthesia Plan: Spinal   Post-op Pain Management:    Induction:   PONV Risk Score and Plan: 1 and Propofol infusion, Treatment may vary due to age or medical condition, Ondansetron and TIVA  Airway Management Planned: Nasal Cannula and Simple Face Mask  Additional Equipment: None  Intra-op Plan:   Post-operative Plan:   Informed Consent: I have reviewed the patients History and Physical, chart, labs and discussed the procedure including the risks, benefits and alternatives for the proposed anesthesia with the patient or authorized representative who has indicated his/her understanding and acceptance.       Plan Discussed with:   Anesthesia Plan Comments:         Anesthesia Quick Evaluation

## 2020-09-02 ENCOUNTER — Encounter (HOSPITAL_COMMUNITY): Payer: Self-pay | Admitting: Orthopedic Surgery

## 2020-09-02 DIAGNOSIS — Z96642 Presence of left artificial hip joint: Secondary | ICD-10-CM | POA: Diagnosis not present

## 2020-09-02 DIAGNOSIS — Z8546 Personal history of malignant neoplasm of prostate: Secondary | ICD-10-CM | POA: Diagnosis not present

## 2020-09-02 DIAGNOSIS — Z96611 Presence of right artificial shoulder joint: Secondary | ICD-10-CM | POA: Diagnosis not present

## 2020-09-02 DIAGNOSIS — E119 Type 2 diabetes mellitus without complications: Secondary | ICD-10-CM | POA: Diagnosis not present

## 2020-09-02 DIAGNOSIS — I10 Essential (primary) hypertension: Secondary | ICD-10-CM | POA: Diagnosis not present

## 2020-09-02 DIAGNOSIS — M1611 Unilateral primary osteoarthritis, right hip: Secondary | ICD-10-CM | POA: Diagnosis not present

## 2020-09-02 LAB — CBC
HCT: 36.1 % — ABNORMAL LOW (ref 39.0–52.0)
Hemoglobin: 11.1 g/dL — ABNORMAL LOW (ref 13.0–17.0)
MCH: 28.6 pg (ref 26.0–34.0)
MCHC: 30.7 g/dL (ref 30.0–36.0)
MCV: 93 fL (ref 80.0–100.0)
Platelets: 216 10*3/uL (ref 150–400)
RBC: 3.88 MIL/uL — ABNORMAL LOW (ref 4.22–5.81)
RDW: 13.4 % (ref 11.5–15.5)
WBC: 9.6 10*3/uL (ref 4.0–10.5)
nRBC: 0 % (ref 0.0–0.2)

## 2020-09-02 LAB — BASIC METABOLIC PANEL
Anion gap: 6 (ref 5–15)
BUN: 8 mg/dL (ref 8–23)
CO2: 27 mmol/L (ref 22–32)
Calcium: 8.9 mg/dL (ref 8.9–10.3)
Chloride: 105 mmol/L (ref 98–111)
Creatinine, Ser: 0.56 mg/dL — ABNORMAL LOW (ref 0.61–1.24)
GFR, Estimated: >60 mL/min (ref >60)
Glucose, Bld: 213 mg/dL — ABNORMAL HIGH (ref 70–99)
Potassium: 4.3 mmol/L (ref 3.5–5.1)
Sodium: 138 mmol/L (ref 135–145)

## 2020-09-02 LAB — GLUCOSE, CAPILLARY
Glucose-Capillary: 206 mg/dL — ABNORMAL HIGH (ref 70–99)
Glucose-Capillary: 150 mg/dL — ABNORMAL HIGH (ref 70–99)

## 2020-09-02 MED ORDER — RIVAROXABAN 10 MG PO TABS: 10.0000 mg | ORAL_TABLET | Freq: Every day | ORAL | 0 refills | Status: DC

## 2020-09-02 MED ORDER — METHOCARBAMOL 500 MG PO TABS: 500.0000 mg | ORAL_TABLET | Freq: Four times a day (QID) | ORAL | 0 refills | Status: DC | PRN

## 2020-09-02 MED ORDER — TRAMADOL HCL 50 MG PO TABS: 50.0000 mg | ORAL_TABLET | Freq: Four times a day (QID) | ORAL | 0 refills | Status: DC | PRN

## 2020-09-02 MED ORDER — HYDROCODONE-ACETAMINOPHEN 5-325 MG PO TABS: 1.0000 | ORAL_TABLET | ORAL | 0 refills | Status: DC | PRN

## 2020-09-02 NOTE — Progress Notes (Signed)
Physical Therapy Treatment Patient Details Name: Douglas Rasmussen MRN: [id number] DOB: 05/14/1945 Today's Date: 09/02/2020    History of Present Illness Pt is a 75 y.o. male s/p Rt THA on 09/01/20. PMH significant for prostate cancer, DM, arthritis, HTN, Lt THA (2012), B total shoulder arthroplasty.    PT Comments    Pt performed supine to sit transfer with supervision to min guard HHA for trunk to upright. PT reviewed use of gait belt to assist with Rt LE during bed mobility. Pt demonstrated good carryover of RW management from earlier session requiring less cuing to maintain safety. Pt was able to perform safe stair negotiation with MIN guard for safety/stability with 1 handrail and cues for sequencing. Pt's wife demonstrated safe guarding position when assisting during mobility with cues from therapist. PT reviewed LE HEP with no complaints of increased pain or fatigue, pt demonstrated good understanding. Pt will benefit from continued skilled PT in order to address listed impairments and maximize functional mobility to return to PLOF.    Follow Up Recommendations  Follow surgeon's recommendation for DC plan and follow-up therapies     Equipment Recommendations  None recommended by PT (pt owns RW)    Recommendations for Other Services       Precautions / Restrictions Precautions Precautions: Fall Restrictions Weight Bearing Restrictions: No    Mobility  Bed Mobility Overal bed mobility: Needs Assistance Bed Mobility: Supine to Sit;Sit to Supine     Supine to sit: Supervision;HOB elevated Sit to supine: Min guard;HOB elevated   General bed mobility comments: x2; supervision for safety with use bed rails and HHA on 2nd trial to assist trunk to upright with pt using gait belt to assist with Rt LE progression. PT reviewed safe guarding position and body mechanics with pt's wife for assisting with transfer    Transfers Overall transfer level: Needs assistance Equipment used:  Rolling walker (2 wheeled) Transfers: Sit to/from Stand Sit to Stand: Min guard         General transfer comment: MIN guard for safety with sit to stand from EOB. Pt demonstrating safe hand placement for transfer.  Ambulation/Gait Ambulation/Gait assistance: Min guard;Supervision Gait Distance (Feet): 80 Feet Assistive device: Rolling walker (2 wheeled) Gait Pattern/deviations: Step-to pattern;Step-through pattern;Decreased stride length;Decreased weight shift to right Gait velocity: decr   General Gait Details: pt with MIN guard progressing to supervision for safety. Pt demonstrated good carryover from earlier session for safe use of RW. Pt maintained safe proximity to RW and kept RW in contact with the floor at all times throughout session. Pt's wife was able to demonstrate safe guarding position with cues from therapist.   Stairs Stairs: Yes Stairs assistance: Min guard Stair Management: One rail Left;Forwards Number of Stairs: 5 General stair comments: x2; MIN guard for pt safety and cues for proper sequencing with stair negotiation "up with the good, down with the bad." Pt's wife demonstrated safe guarding positon on 2nd trial following verbal cues and demonstration from therapist. Pt was able to verbalize and demonstrate correct sequencing technique with minimal cuing.   Wheelchair Mobility    Modified Rankin (Stroke Patients Only)       Balance Overall balance assessment: Needs assistance Sitting-balance support: Feet supported Sitting balance-Leahy Scale: Good     Standing balance support: Bilateral upper extremity supported Standing balance-Leahy Scale: Fair Standing balance comment: use of RW, pt able to stand for toileting and to perform hand hygiene at sink with supervision and no UE support with no  LOB observed.                            Cognition Arousal/Alertness: Awake/alert Behavior During Therapy: WFL for tasks assessed/performed Overall  Cognitive Status: Within Functional Limits for tasks assessed                                        Exercises Total Joint Exercises Ankle Circles/Pumps: AROM;Both;20 reps;Supine Short Arc Quad: AROM;Right;10 reps;Supine Heel Slides: AROM;Right;10 reps;Supine Hip ABduction/ADduction: AROM;Right;10 reps;Supine Long Arc Quad: AROM;Right;5 reps;Seated    General Comments        Pertinent Vitals/Pain Pain Assessment: 0-10 Pain Score: 4  Pain Location: Rt hip Pain Descriptors / Indicators: Discomfort;Grimacing;Sore Pain Intervention(s): Limited activity within patient's tolerance;Monitored during session;Repositioned    Home Living                      Prior Function            PT Goals (current goals can now be found in the care plan section) Acute Rehab PT Goals Patient Stated Goal: get back to hobbies like fishing and moving with less pain PT Goal Formulation: With patient Time For Goal Achievement: 09/16/20 Potential to Achieve Goals: Good Progress towards PT goals: Progressing toward goals    Frequency    7X/week      PT Plan Current plan remains appropriate    Co-evaluation              AM-PAC PT "6 Clicks" Mobility   Outcome Measure  Help needed turning from your back to your side while in a flat bed without using bedrails?: A Little Help needed moving from lying on your back to sitting on the side of a flat bed without using bedrails?: A Little Help needed moving to and from a bed to a chair (including a wheelchair)?: A Little Help needed standing up from a chair using your arms (e.g., wheelchair or bedside chair)?: A Little Help needed to walk in hospital room?: A Little Help needed climbing 3-5 steps with a railing? : A Little 6 Click Score: 18    End of Session Equipment Utilized During Treatment: Gait belt Activity Tolerance: Patient tolerated treatment well Patient left: with call bell/phone within reach;in bed;with  bed alarm set;with family/visitor present Nurse Communication: Mobility status PT Visit Diagnosis: Unsteadiness on feet (R26.81);Muscle weakness (generalized) (M62.81);Pain Pain - Right/Left: Right Pain - part of body: Hip     Time: 2080-2233 PT Time Calculation (min) (ACUTE ONLY): 31 min  Charges:  $Therapeutic Exercise: 8-22 mins $Therapeutic Activity: 8-22 mins                     Festus Barren PT, DPT  Acute Rehabilitation Services  Office [phone]     09/02/2020, 1:31 PM

## 2020-09-02 NOTE — Evaluation (Signed)
Physical Therapy Evaluation Patient Details Name: Douglas Rasmussen MRN: [id number] DOB: 01-05-1946 Today's Date: 09/02/2020   History of Present Illness  Pt is a 75 y.o. male s/p Rt THA on 09/01/20. PMH significant for prostate cancer, DM, arthritis, HTN, Lt THA (2012), B total shoulder arthroplasty.  Clinical Impression  Pt is s/p Rt THA resulting in the deficits listed below (see PT Problem List). PT reviewed therapeutic exercise for promotion of DVT prevention and improved strength/ROM, pt demonstrated understanding. Pt ambulated 183ft with use of RW and MIN guard progressing to supervision requiring multiple cues for RW management to maintain pt safety. Pt will need additional session today for stair training and to assess carryover for proper use of assistive device to ensure safety upon discharge. Pt has assist from his wife at home and has additional family/friends that can assist if needed. Recommend home with family . Pt will benefit from skilled PT to address listed impairments and maximize functional mobility to return to PLOF.       Follow Up Recommendations Follow surgeon's recommendation for DC plan and follow-up therapies    Equipment Recommendations  None recommended by PT (pt owns RW)    Recommendations for Other Services       Precautions / Restrictions Precautions Precautions: Fall Restrictions Weight Bearing Restrictions: No      Mobility  Bed Mobility Overal bed mobility: Needs Assistance Bed Mobility: Supine to Sit     Supine to sit: Supervision;HOB elevated     General bed mobility comments: PT educated pt on WBAT status prior to mobility. supervision for safety with use of bed rails to assist trunk to upright    Transfers Overall transfer level: Needs assistance Equipment used: Rolling walker (2 wheeled) Transfers: Sit to/from Stand Sit to Stand: Min guard         General transfer comment: MIN guard for safety with sit to stand from EOB. Pt able to  verbalize and demonstrate safe hand placement for transfer.  Ambulation/Gait Ambulation/Gait assistance: Min guard;Supervision Gait Distance (Feet): 120 Feet Assistive device: Rolling walker (2 wheeled) Gait Pattern/deviations: Step-to pattern;Step-through pattern;Decreased stride length;Decreased weight shift to right Gait velocity: decr   General Gait Details: pt with MIN guard progressing to supervision for safety with cues for step to gait pattern, progressing to step through with no LOB observed. Pt required multiple verbal and tactile cues to maintain safe proximity to RW, to keep RW in contact with floor at all times, and to maintain B UEs on RW to maximize safety. Intermittent tactile cuing provided to maintain RW on floor with turns during ambulation.  Stairs            Wheelchair Mobility    Modified Rankin (Stroke Patients Only)       Balance Overall balance assessment: Needs assistance Sitting-balance support: Feet supported Sitting balance-Leahy Scale: Good     Standing balance support: Bilateral upper extremity supported Standing balance-Leahy Scale: Fair Standing balance comment: use of RW, pt able to stand for toileting and to perform hand hygiene at sink without UE support with no LOB observed.                             Pertinent Vitals/Pain Pain Assessment: 0-10 Pain Score: 6  Pain Location: Rt hip Pain Descriptors / Indicators: Discomfort;Grimacing;Sore Pain Intervention(s): Limited activity within patient's tolerance;Monitored during session;Repositioned;Ice applied    Home Living Family/patient expects to be discharged to:: Private residence Living  Arrangements: Spouse/significant other Available Help at Discharge: Family;Neighbor Type of Home: House Home Access: Stairs to enter Entrance Stairs-Rails: Left Entrance Stairs-Number of Steps: 3 Home Layout: One level Home Equipment: Toilet riser;Cane - single point;Walker - 2  wheels;Walker - standard;Bedside commode;Grab bars - toilet;Grab bars - tub/shower;Wheelchair - manual Additional Comments: Pt has additonal family/friends who can assist if needed    Prior Function Level of Independence: Independent with assistive device(s)         Comments: use of SPC past few months due to pain, was mobile without assistive device prior     Hand Dominance   Dominant Hand: Right    Extremity/Trunk Assessment   Upper Extremity Assessment Upper Extremity Assessment: Overall WFL for tasks assessed    Lower Extremity Assessment Lower Extremity Assessment: RLE deficits/detail RLE Deficits / Details: Pt with 4+/5 quad set strength and 5/5 B dorsi/plantar flexion strength RLE Sensation: WNL RLE Coordination: WNL    Cervical / Trunk Assessment Cervical / Trunk Assessment: Normal  Communication   Communication: No difficulties  Cognition Arousal/Alertness: Awake/alert Behavior During Therapy: WFL for tasks assessed/performed Overall Cognitive Status: Within Functional Limits for tasks assessed                                        General Comments      Exercises Total Joint Exercises Ankle Circles/Pumps: AROM;Both;20 reps;Supine Quad Sets: AROM;Right;10 reps;Supine Heel Slides: AROM;Right;10 reps;Supine   Assessment/Plan    PT Assessment Patient needs continued PT services  PT Problem List Decreased strength;Decreased range of motion;Decreased activity tolerance;Decreased balance;Decreased mobility;Decreased knowledge of use of DME;Pain       PT Treatment Interventions DME instruction;Gait training;Stair training;Functional mobility training;Therapeutic activities;Therapeutic exercise;Patient/family education    PT Goals (Current goals can be found in the Care Plan section)  Acute Rehab PT Goals Patient Stated Goal: get back to hobbies like fishing and moving with less pain PT Goal Formulation: With patient Time For Goal  Achievement: 09/16/20 Potential to Achieve Goals: Good    Frequency 7X/week   Barriers to discharge        Co-evaluation               AM-PAC PT "6 Clicks" Mobility  Outcome Measure Help needed turning from your back to your side while in a flat bed without using bedrails?: A Little Help needed moving from lying on your back to sitting on the side of a flat bed without using bedrails?: A Little Help needed moving to and from a bed to a chair (including a wheelchair)?: A Little Help needed standing up from a chair using your arms (e.g., wheelchair or bedside chair)?: A Little Help needed to walk in hospital room?: A Little Help needed climbing 3-5 steps with a railing? : A Little 6 Click Score: 18    End of Session Equipment Utilized During Treatment: Gait belt Activity Tolerance: Patient tolerated treatment well Patient left: in chair;with call bell/phone within reach;with chair alarm set Nurse Communication: Mobility status PT Visit Diagnosis: Unsteadiness on feet (R26.81);Muscle weakness (generalized) (M62.81);Pain Pain - Right/Left: Right Pain - part of body: Hip    Time: 7169-6789 PT Time Calculation (min) (ACUTE ONLY): 33 min   Charges:   PT Evaluation $PT Eval Low Complexity: 1 Low PT Treatments $Therapeutic Activity: 23-37 mins        Festus Barren PT, DPT  Acute Rehabilitation Services  Office  [phone]   09/02/2020, 9:55 AM

## 2020-09-02 NOTE — TOC Transition Note (Signed)
Transition of Care [facility]) - CM/SW Discharge Note   Patient Details  Name: Douglas Rasmussen MRN: [id number] Date of Birth: June 03, 1945  [facility] CM/SW Contact:  Lennart Pall, LCSW Phone Number: 09/02/2020, 1:34 PM   Clinical Narrative:    Met briefly with pt and wife.  Confirming plan for HEP.  Pt has all needed DME at home.  No TOC needs.   Final next level of care: Home/Self Care Barriers to Discharge: No Barriers Identified   Patient Goals and CMS Choice Patient states their goals for this hospitalization and ongoing recovery are:: return home      Discharge Placement                       Discharge Plan and Services                DME Arranged: N/A DME Agency: NA       HH Arranged: NA HH Agency: NA        Social Determinants of Health (SDOH) Interventions     Readmission Risk Interventions No flowsheet data found.

## 2020-09-02 NOTE — Progress Notes (Signed)
   Subjective: 1 Day Post-Op Procedure(s) (LRB): TOTAL HIP ARTHROPLASTY ANTERIOR APPROACH (Right) Patient reports pain as mild.   Patient seen in rounds by Dr. Wynelle Link. Patient is well, and has had no acute complaints or problems. Denies SOB, chest pain, or calf pain. No acute overnight events. We will begin PT today.   Objective: Vital signs in last 24 hours: Temp:  [97.4 F (36.3 C)-98.4 F (36.9 C)] 98 F (36.7 C) (06/30 0516) Pulse Rate:  [69-91] 74 (06/30 0516) Resp:  [11-25] 18 (06/30 0516) BP: (91-153)/(61-84) 112/61 (06/30 0516) SpO2:  [93 %-100 %] 95 % (06/30 0516) Weight:  [90.2 kg] 90.2 kg (06/29 0758)  Intake/Output from previous day:  Intake/Output Summary (Last 24 hours) at 09/02/2020 0756 Last data filed at 09/02/2020 0600 Gross per 24 hour  Intake 3989.96 ml  Output 4450 ml  Net -460.04 ml     Intake/Output this shift: No intake/output data recorded.  Labs: Recent Labs    09/01/20 1233 09/02/20 0322  HGB 11.8* 11.1*   Recent Labs    09/01/20 1233 09/02/20 0322  WBC 5.4 9.6  RBC 4.07* 3.88*  HCT 38.3* 36.1*  PLT 205 216   Recent Labs    09/01/20 1233 09/02/20 0322  NA 137 138  K 4.5 4.3  CL 104 105  CO2 28 27  BUN 14 8  CREATININE 0.63 0.56*  GLUCOSE 154* 213*  CALCIUM 9.0 8.9   No results for input(s): LABPT, INR in the last 72 hours.  Exam: General - Patient is Alert and Oriented Extremity - Neurologically intact Neurovascular intact Intact pulses distally Dressing - dressing C/D/I Motor Function - intact, moving foot and toes well on exam.   Past Medical History:  Diagnosis Date   Arthritis    "qwhere" (01/13/2016)   Cancer ([place])    Prostate   Diabetes mellitus without complication ([place])    Fatty liver    H/O cardiovascular stress test 12/2015   History of kidney stones    last episode 2-3 yrs. ago, but also remarks that he has had cystocopy & lithotripsy    Hypertension    Sleep apnea     Assessment/Plan: 1 Day  Post-Op Procedure(s) (LRB): TOTAL HIP ARTHROPLASTY ANTERIOR APPROACH (Right) Principal Problem:   OA (osteoarthritis) of hip Active Problems:   S/P total right hip arthroplasty  Estimated body mass index is 30.23 kg/m as calculated from the following:   Height as of this encounter: 5\' 8"  (1.727 m).   Weight as of this encounter: 90.2 kg. Advance diet  DVT Prophylaxis - Xarelto and TED hose Weight bearing as tolerated. Continue therapy.  Plan is to go Home after hospital stay.   Plan for two sessions with PT this morning, and if meeting goals, will plan for discharge this afternoon.   Patient to follow up in two weeks with Dr. Wynelle Link in clinic.   The PDMP database was reviewed today prior to any opioid medications being prescribed to this patient.Fenton Foy, [place], PA-C Orthopedic Surgery [phone] 09/02/2020, 7:56 AM

## 2020-09-02 NOTE — Plan of Care (Signed)
  Problem: Pain Management: Goal: Pain level will decrease with appropriate interventions Outcome: Progressing   

## 2020-09-03 LAB — HEMOGLOBIN A1C
Hgb A1c MFr Bld: 7.1 % — ABNORMAL HIGH (ref 4.8–5.6)
Mean Plasma Glucose: 157 mg/dL

## 2020-09-07 ENCOUNTER — Ambulatory Visit: Payer: Medicare Other | Admitting: Urology

## 2020-09-09 NOTE — Discharge Summary (Signed)
Physician Discharge Summary   Patient ID: Douglas Rasmussen MRN: [id number] DOB/AGE: 09-11-1945 75 y.o.  Admit date: 09/01/2020 Discharge date: 09/02/2020  Primary Diagnosis:  OA of Right Hip; Right THA anterior approach  Admission Diagnoses:  Past Medical History:  Diagnosis Date   Arthritis    "qwhere" (01/13/2016)   Cancer ([place])    Prostate   Diabetes mellitus without complication ([place])    Fatty liver    H/O cardiovascular stress test 12/2015   History of kidney stones    last episode 2-3 yrs. ago, but also remarks that he has had cystocopy & lithotripsy    Hypertension    Sleep apnea    Discharge Diagnoses:   Principal Problem:   OA (osteoarthritis) of hip Active Problems:   S/P total right hip arthroplasty  Estimated body mass index is 30.23 kg/m as calculated from the following:   Height as of this encounter: 5\' 8"  (1.727 m).   Weight as of this encounter: 90.2 kg.  Procedure:  Procedure(s) (LRB): TOTAL HIP ARTHROPLASTY ANTERIOR APPROACH (Right)   Consults: None  HPI: Douglas Rasmussen is a 75 y.o. male who has advanced end- stage arthritis of their Right hip with progressively worsening pain and dysfunction.The patient has failed nonoperative management and presents for total hip arthroplasty.  Laboratory Data: Admission on 09/01/2020, Discharged on 09/02/2020  Component Date Value Ref Range Status   WBC 09/01/2020 5.4  4.0 - 10.5 K/uL Final   RBC 09/01/2020 4.07 (A) 4.22 - 5.81 MIL/uL Final   Hemoglobin 09/01/2020 11.8 (A) 13.0 - 17.0 g/dL Final   HCT 09/01/2020 38.3 (A) 39.0 - 52.0 % Final   MCV 09/01/2020 94.1  80.0 - 100.0 fL Final   MCH 09/01/2020 29.0  26.0 - 34.0 pg Final   MCHC 09/01/2020 30.8  30.0 - 36.0 g/dL Final   RDW 09/01/2020 13.6  11.5 - 15.5 % Final   Platelets 09/01/2020 205  150 - 400 K/uL Final   nRBC 09/01/2020 0.0  0.0 - 0.2 % Final   Performed at [facility], [place] [address], [place], [place] [address]   Sodium  09/01/2020 137  135 - 145 mmol/L Final   Potassium 09/01/2020 4.5  3.5 - 5.1 mmol/L Final   Chloride 09/01/2020 104  98 - 111 mmol/L Final   CO2 09/01/2020 28  22 - 32 mmol/L Final   Glucose, Bld 09/01/2020 154 (A) 70 - 99 mg/dL Final   Glucose reference range applies only to samples taken after fasting for at least 8 hours.   BUN 09/01/2020 14  8 - 23 mg/dL Final   Creatinine, Ser 09/01/2020 0.63  0.61 - 1.24 mg/dL Final   Calcium 09/01/2020 9.0  8.9 - 10.3 mg/dL Final   Total Protein 09/01/2020 6.7  6.5 - 8.1 g/dL Final   Albumin 09/01/2020 3.8  3.5 - 5.0 g/dL Final   AST 09/01/2020 16  15 - 41 U/L Final   ALT 09/01/2020 11  0 - 44 U/L Final   Alkaline Phosphatase 09/01/2020 54  38 - 126 U/L Final   Total Bilirubin 09/01/2020 0.4  0.3 - 1.2 mg/dL Final   GFR, Estimated 09/01/2020 >60  >60 mL/min Final   Comment: (NOTE) Calculated using the CKD-EPI Creatinine Equation (2021)    Anion gap 09/01/2020 5  5 - 15 Final   Performed at [facility], [place] [address] [place], [place] [address]   Hgb A1c MFr Bld 09/01/2020 7.1 (A) 4.8 -  5.6 % Final   Comment: (NOTE)         Prediabetes: 5.7 - 6.4         Diabetes: >6.4         Glycemic control for adults with diabetes: <7.0    Mean Plasma Glucose 09/01/2020 157  mg/dL Final   Comment: (NOTE) Performed At: [facility] [place], [place] [id number] Rush Farmer MD [id number]    Glucose-Capillary 09/01/2020 127 (A) 70 - 99 mg/dL Final   Glucose reference range applies only to samples taken after fasting for at least 8 hours.   Glucose-Capillary 09/01/2020 124 (A) 70 - 99 mg/dL Final   Glucose reference range applies only to samples taken after fasting for at least 8 hours.   WBC 09/02/2020 9.6  4.0 - 10.5 K/uL Final   RBC 09/02/2020 3.88 (A) 4.22 - 5.81 MIL/uL Final   Hemoglobin 09/02/2020 11.1 (A) 13.0 - 17.0 g/dL Final   HCT 09/02/2020 36.1 (A) 39.0 - 52.0 % Final   MCV 09/02/2020 93.0   80.0 - 100.0 fL Final   MCH 09/02/2020 28.6  26.0 - 34.0 pg Final   MCHC 09/02/2020 30.7  30.0 - 36.0 g/dL Final   RDW 09/02/2020 13.4  11.5 - 15.5 % Final   Platelets 09/02/2020 216  150 - 400 K/uL Final   nRBC 09/02/2020 0.0  0.0 - 0.2 % Final   Performed at [facility], [place] [address], [place], [place] [address]   Sodium 09/02/2020 138  135 - 145 mmol/L Final   Potassium 09/02/2020 4.3  3.5 - 5.1 mmol/L Final   Chloride 09/02/2020 105  98 - 111 mmol/L Final   CO2 09/02/2020 27  22 - 32 mmol/L Final   Glucose, Bld 09/02/2020 213 (A) 70 - 99 mg/dL Final   Glucose reference range applies only to samples taken after fasting for at least 8 hours.   BUN 09/02/2020 8  8 - 23 mg/dL Final   Creatinine, Ser 09/02/2020 0.56 (A) 0.61 - 1.24 mg/dL Final   Calcium 09/02/2020 8.9  8.9 - 10.3 mg/dL Final   GFR, Estimated 09/02/2020 >60  >60 mL/min Final   Comment: (NOTE) Calculated using the CKD-EPI Creatinine Equation (2021)    Anion gap 09/02/2020 6  5 - 15 Final   Performed at [facility], [place] [address] [place], [place] [address]   Glucose-Capillary 09/01/2020 212 (A) 70 - 99 mg/dL Final   Glucose reference range applies only to samples taken after fasting for at least 8 hours.   Glucose-Capillary 09/01/2020 201 (A) 70 - 99 mg/dL Final   Glucose reference range applies only to samples taken after fasting for at least 8 hours.   Glucose-Capillary 09/02/2020 206 (A) 70 - 99 mg/dL Final   Glucose reference range applies only to samples taken after fasting for at least 8 hours.   Glucose-Capillary 09/02/2020 150 (A) 70 - 99 mg/dL Final   Glucose reference range applies only to samples taken after fasting for at least 8 hours.   Comment 1 09/02/2020 Notify RN   Final   Comment 2 09/02/2020 Document in Chart   Final  Hospital Outpatient Visit on 08/30/2020  Component Date Value Ref Range Status   SARS Coronavirus 2 08/30/2020 NEGATIVE  NEGATIVE Final    Comment: (NOTE) SARS-CoV-2 target nucleic acids are NOT DETECTED.  The SARS-CoV-2 RNA is generally detectable in upper and lower respiratory specimens during the acute phase of infection. Negative  results do not preclude SARS-CoV-2 infection, do not rule out co-infections with other pathogens, and should not be used as the sole basis for treatment or other patient management decisions. Negative results must be combined with clinical observations, patient history, and epidemiological information. The expected result is Negative.  Fact Sheet for Patients: SugarRoll.be  Fact Sheet for Healthcare Providers: https://www.woods-mathews.com/  This test is not yet approved or cleared by the [place] FDA and  has been authorized for detection and/or diagnosis of SARS-CoV-2 by FDA under an Emergency Use Authorization (EUA). This EUA will remain  in effect (meaning this test can be used) for the duration of the COVID-19 declaration under Se                          ction 564(b)(1) of the Act, 21 U.S.C. section 360bbb-3(b)(1), unless the authorization is terminated or revoked sooner.  Performed at [facility], [place] [address] [place], [place] [address]   Hospital Outpatient Visit on 08/24/2020  Component Date Value Ref Range Status   Hgb A1c MFr Bld 08/24/2020 7.1 (A) 4.8 - 5.6 % Final   Comment: (NOTE)         Prediabetes: 5.7 - 6.4         Diabetes: >6.4         Glycemic control for adults with diabetes: <7.0    Mean Plasma Glucose 08/24/2020 157  mg/dL Final   Comment: (NOTE) Performed At: [facility] [place], [place] [id number] Rush Farmer MD XQ:[id number]    Sodium 08/24/2020 138  135 - 145 mmol/L Final   Potassium 08/24/2020 4.5  3.5 - 5.1 mmol/L Final   Chloride 08/24/2020 104  98 - 111 mmol/L Final   CO2 08/24/2020 25  22 - 32 mmol/L Final   Glucose, Bld 08/24/2020 121 (A) 70 - 99 mg/dL Final    Glucose reference range applies only to samples taken after fasting for at least 8 hours.   BUN 08/24/2020 12  8 - 23 mg/dL Final   Creatinine, Ser 08/24/2020 0.58 (A) 0.61 - 1.24 mg/dL Final   Calcium 08/24/2020 9.8  8.9 - 10.3 mg/dL Final   Total Protein 08/24/2020 7.6  6.5 - 8.1 g/dL Final   Albumin 08/24/2020 4.0  3.5 - 5.0 g/dL Final   AST 08/24/2020 19  15 - 41 U/L Final   ALT 08/24/2020 13  0 - 44 U/L Final   Alkaline Phosphatase 08/24/2020 68  38 - 126 U/L Final   Total Bilirubin 08/24/2020 0.7  0.3 - 1.2 mg/dL Final   GFR, Estimated 08/24/2020 >60  >60 mL/min Final   Comment: (NOTE) Calculated using the CKD-EPI Creatinine Equation (2021)    Anion gap 08/24/2020 9  5 - 15 Final   Performed at [facility], [place] [address], [place], [place] [address]   WBC 08/24/2020 6.2  4.0 - 10.5 K/uL Final   RBC 08/24/2020 4.70  4.22 - 5.81 MIL/uL Final   Hemoglobin 08/24/2020 13.5  13.0 - 17.0 g/dL Final   HCT 08/24/2020 43.7  39.0 - 52.0 % Final   MCV 08/24/2020 93.0  80.0 - 100.0 fL Final   MCH 08/24/2020 28.7  26.0 - 34.0 pg Final   MCHC 08/24/2020 30.9  30.0 - 36.0 g/dL Final   RDW 08/24/2020 13.7  11.5 - 15.5 % Final   Platelets 08/24/2020 234  150 - 400 K/uL Final   nRBC 08/24/2020  0.0  0.0 - 0.2 % Final   Performed at [place] [address] [place], [place] [address]   Prothrombin Time 08/24/2020 13.3  11.4 - 15.2 seconds Final   INR 08/24/2020 1.0  0.8 - 1.2 Final   Comment: (NOTE) INR goal varies based on device and disease states. Performed at [facility], [place] [address], [place], [place] [address]    aPTT 08/24/2020 29  24 - 36 seconds Final   Performed at [facility], [place] [address], [place], [place] [address]   ABO/RH(D) 08/24/2020 A POS   Final   Antibody Screen 08/24/2020 NEG   Final   Sample Expiration 08/24/2020 09/04/2020,2359   Final   Extend sample reason 08/24/2020    Final                    Value:NO TRANSFUSIONS OR PREGNANCY IN THE PAST 3 MONTHS Performed at [place] [address], [place], [place] [address]    MRSA, PCR 08/24/2020 NEGATIVE  NEGATIVE Final   Staphylococcus aureus 08/24/2020 NEGATIVE  NEGATIVE Final   Comment: (NOTE) The Xpert SA Assay (FDA approved for NASAL specimens in patients 64 years of age and older), is one component of a comprehensive surveillance program. It is not intended to diagnose infection nor to guide or monitor treatment. Performed at [facility], [place] [address] [place], [place] [address]    Glucose-Capillary 08/24/2020 127 (A) 70 - 99 mg/dL Final   Glucose reference range applies only to samples taken after fasting for at least 8 hours.     X-Rays:DG Pelvis Portable  Result Date: 09/01/2020 CLINICAL DATA:  Right hip replacement EXAM: PORTABLE PELVIS 1-2 VIEWS COMPARISON:  12/26/2010 FINDINGS: Right hip replacement in satisfactory position alignment. No fracture or complication Pre-existing left hip replacement without complication. IMPRESSION: Satisfactory right hip replacement. Electronically Signed   By: Franchot Gallo M.D.   On: 09/01/2020 13:35   DG C-Arm 1-60 Min-No Report  Result Date: 09/01/2020 CLINICAL DATA:  Right hip replacement EXAM: OPERATIVE right HIP (WITH PELVIS IF PERFORMED) 4 VIEWS TECHNIQUE: Fluoroscopic spot image(s) were submitted for interpretation post-operatively. COMPARISON:  12/26/2010 FINDINGS: Right hip replacement in satisfactory position alignment. No complication Pre-existing left hip replacement without complication IMPRESSION: Satisfactory right hip replacement. Electronically Signed   By: Franchot Gallo M.D.   On: 09/01/2020 13:36   DG HIP OPERATIVE UNILAT W OR W/O PELVIS RIGHT  Result Date: 09/01/2020 CLINICAL DATA:  Right hip replacement EXAM: OPERATIVE right HIP (WITH PELVIS IF PERFORMED) 4 VIEWS TECHNIQUE: Fluoroscopic spot image(s) were  submitted for interpretation post-operatively. COMPARISON:  12/26/2010 FINDINGS: Right hip replacement in satisfactory position alignment. No complication Pre-existing left hip replacement without complication IMPRESSION: Satisfactory right hip replacement. Electronically Signed   By: Franchot Gallo M.D.   On: 09/01/2020 13:36    EKG: Orders placed or performed during the hospital encounter of 08/24/20   EKG 12-Lead   EKG 12-Lead     Hospital Course: Douglas Rasmussen is a 75 y.o. who was admitted to [facility]. They were brought to the operating room on 09/01/2020 and underwent Procedure(s): [place].  Patient tolerated the procedure well and was later transferred to the recovery room and then to the orthopaedic floor for postoperative care. They were given PO and IV analgesics for pain control following their surgery. They were given 24 hours of postoperative antibiotics of  Anti-infectives (From  admission, onward)    Start     Dose/Rate Route Frequency Ordered Stop   09/01/20 1700  ceFAZolin (ANCEF) IVPB 2g/100 mL premix        2 g 200 mL/hr over 30 Minutes Intravenous Every 6 hours 09/01/20 1638 09/02/20 0050   09/01/20 0745  ceFAZolin (ANCEF) IVPB 2g/100 mL premix        2 g 200 mL/hr over 30 Minutes Intravenous On call to O.R. 09/01/20 0740 09/01/20 1045      and started on DVT prophylaxis in the form of Xarelto and TED hose.   PT and OT were ordered for total joint protocol. Discharge planning consulted to help with postop disposition and equipment needs.  Patient had an uneventful night on the evening of surgery. They started to get up OOB with therapy on 09/01/2020. Pt was seen during rounds and was ready to go home pending progress with therapy. She worked with therapy on POD #1 and was meeting goals. Pt was discharged to home later that day in stable condition.  Diet: Regular diet Activity: WBAT Follow-up: in two weeks Disposition:  Home Discharged Condition: good   Discharge Instructions     Call MD / Call 911   Complete by: As directed    If you experience chest pain or shortness of breath, CALL 911 and be transported to the hospital emergency room.  If you develope a fever above 101 F, pus (white drainage) or increased drainage or redness at the wound, or calf pain, call your surgeon's office.   Change dressing   Complete by: As directed    You have an adhesive waterproof bandage over the incision. Leave this in place until your first follow-up appointment. Once you remove this you will not need to place another bandage.   Constipation Prevention   Complete by: As directed    Drink plenty of fluids.  Prune juice may be helpful.  You may use a stool softener, such as Colace (over the counter) 100 mg twice a day.  Use MiraLax (over the counter) for constipation as needed.   Diet - low sodium heart healthy   Complete by: As directed    Do not sit on low chairs, stoools or toilet seats, as it may be difficult to get up from low surfaces   Complete by: As directed    Driving restrictions   Complete by: As directed    No driving for two weeks   Post-operative opioid taper instructions:   Complete by: As directed    POST-OPERATIVE OPIOID TAPER INSTRUCTIONS: It is important to wean off of your opioid medication as soon as possible. If you do not need pain medication after your surgery it is ok to stop day one. Opioids include: Codeine, Hydrocodone(Norco, Vicodin), Oxycodone(Percocet, oxycontin) and hydromorphone amongst others.  Long term and even short term use of opiods can cause: Increased pain response Dependence Constipation Depression Respiratory depression And more.  Withdrawal symptoms can include Flu like symptoms Nausea, vomiting And more Techniques to manage these symptoms Hydrate well Eat regular healthy meals Stay active Use relaxation techniques(deep breathing, meditating, yoga) Do Not  substitute Alcohol to help with tapering If you have been on opioids for less than two weeks and do not have pain than it is ok to stop all together.  Plan to wean off of opioids This plan should start within one week post op of your joint replacement. Maintain the same interval or time between taking each dose and first  decrease the dose.  Cut the total daily intake of opioids by one tablet each day Next start to increase the time between doses. The last dose that should be eliminated is the evening dose.      TED hose   Complete by: As directed    Use stockings (TED hose) for three weeks on both leg(s).  You may remove them at night for sleeping.   Weight bearing as tolerated   Complete by: As directed       Allergies as of 09/02/2020   No Known Allergies      Medication List     STOP taking these medications    acetaminophen 500 MG tablet Commonly known as: TYLENOL   BC HEADACHE POWDER PO   Cinnamon 500 MG capsule   CoQ10 200 MG Caps   Fish Oil 1000 MG Caps       TAKE these medications    HYDROcodone-acetaminophen 5-325 MG tablet Commonly known as: NORCO/VICODIN Take 1-2 tablets by mouth every 4 (four) hours as needed for moderate pain.   lisinopril 5 MG tablet Commonly known as: ZESTRIL Take 5 mg by mouth daily.   Lumify 0.025 % Soln Generic drug: Brimonidine Tartrate Place 1 drop into both eyes daily as needed (redness/dryness).   metFORMIN 500 MG tablet Commonly known as: GLUCOPHAGE Take 500 mg by mouth 2 (two) times daily.   methocarbamol 500 MG tablet Commonly known as: ROBAXIN Take 1 tablet (500 mg total) by mouth every 6 (six) hours as needed for muscle spasms.   rivaroxaban 10 MG Tabs tablet Commonly known as: XARELTO Take 1 tablet (10 mg total) by mouth daily with breakfast. Then take one 81 mg aspirin once a day for three weeks. Then discontinue aspirin.   tamsulosin 0.4 MG Caps capsule Commonly known as: FLOMAX Take 0.4 mg by mouth  daily.   traMADol 50 MG tablet Commonly known as: ULTRAM Take 1-2 tablets (50-100 mg total) by mouth every 6 (six) hours as needed for moderate pain. What changed:  how much to take when to take this   ZzzQuil 25 MG Caps Generic drug: diphenhydrAMINE HCl (Sleep) Take 25 mg by mouth at bedtime as needed (sleep).               Discharge Care Instructions  (From admission, onward)           Start     Ordered   09/02/20 0000  Weight bearing as tolerated        09/02/20 0801   09/02/20 0000  Change dressing       Comments: You have an adhesive waterproof bandage over the incision. Leave this in place until your first follow-up appointment. Once you remove this you will not need to place another bandage.   09/02/20 0801            Follow-up Information     Gaynelle Arabian, MD. Go on 09/14/2020.   Specialty: Orthopedic Surgery Why: 5:45 pm Contact information: [address] [place] [address] [phone]                 Signed: Fenton Foy, MBA, PA-C Orthopedic Surgery 09/09/2020, 5:19 PM

## 2020-10-01 DIAGNOSIS — E782 Mixed hyperlipidemia: Secondary | ICD-10-CM | POA: Diagnosis not present

## 2020-10-01 DIAGNOSIS — R7989 Other specified abnormal findings of blood chemistry: Secondary | ICD-10-CM | POA: Diagnosis not present

## 2020-10-01 DIAGNOSIS — E7849 Other hyperlipidemia: Secondary | ICD-10-CM | POA: Diagnosis not present

## 2020-10-01 DIAGNOSIS — E1169 Type 2 diabetes mellitus with other specified complication: Secondary | ICD-10-CM | POA: Diagnosis not present

## 2020-10-05 DIAGNOSIS — Z471 Aftercare following joint replacement surgery: Secondary | ICD-10-CM | POA: Diagnosis not present

## 2020-10-05 DIAGNOSIS — Z96641 Presence of right artificial hip joint: Secondary | ICD-10-CM | POA: Diagnosis not present

## 2020-10-07 DIAGNOSIS — Z96641 Presence of right artificial hip joint: Secondary | ICD-10-CM | POA: Diagnosis not present

## 2020-10-07 DIAGNOSIS — I10 Essential (primary) hypertension: Secondary | ICD-10-CM | POA: Diagnosis not present

## 2020-10-07 DIAGNOSIS — R7989 Other specified abnormal findings of blood chemistry: Secondary | ICD-10-CM | POA: Diagnosis not present

## 2020-10-07 DIAGNOSIS — E1169 Type 2 diabetes mellitus with other specified complication: Secondary | ICD-10-CM | POA: Diagnosis not present

## 2020-10-07 DIAGNOSIS — E7849 Other hyperlipidemia: Secondary | ICD-10-CM | POA: Diagnosis not present

## 2020-10-07 DIAGNOSIS — M1611 Unilateral primary osteoarthritis, right hip: Secondary | ICD-10-CM | POA: Diagnosis not present

## 2020-10-07 DIAGNOSIS — C61 Malignant neoplasm of prostate: Secondary | ICD-10-CM | POA: Diagnosis not present

## 2020-10-07 DIAGNOSIS — K760 Fatty (change of) liver, not elsewhere classified: Secondary | ICD-10-CM | POA: Diagnosis not present

## 2020-10-10 DIAGNOSIS — U071 COVID-19: Secondary | ICD-10-CM | POA: Diagnosis not present

## 2020-10-27 ENCOUNTER — Other Ambulatory Visit: Payer: Medicare Other

## 2020-10-27 ENCOUNTER — Other Ambulatory Visit: Payer: Self-pay

## 2020-10-27 DIAGNOSIS — C61 Malignant neoplasm of prostate: Secondary | ICD-10-CM | POA: Diagnosis not present

## 2020-10-28 LAB — TESTOSTERONE: Testosterone: 242 ng/dL — ABNORMAL LOW (ref 264–916)

## 2020-10-28 LAB — PSA: Prostate Specific Ag, Serum: <0.1 ng/mL (ref 0.0–4.0)

## 2020-11-02 ENCOUNTER — Encounter: Payer: Self-pay | Admitting: Urology

## 2020-11-02 ENCOUNTER — Other Ambulatory Visit: Payer: Self-pay

## 2020-11-02 ENCOUNTER — Ambulatory Visit (INDEPENDENT_AMBULATORY_CARE_PROVIDER_SITE_OTHER): Payer: Medicare Other | Admitting: Urology

## 2020-11-02 VITALS — BP 115/44 | HR 92

## 2020-11-02 DIAGNOSIS — Z8546 Personal history of malignant neoplasm of prostate: Secondary | ICD-10-CM | POA: Diagnosis not present

## 2020-11-02 LAB — URINALYSIS, ROUTINE W REFLEX MICROSCOPIC
Bilirubin, UA: NEGATIVE
Glucose, UA: NEGATIVE
Ketones, UA: NEGATIVE
Leukocytes,UA: NEGATIVE
Nitrite, UA: NEGATIVE
Protein,UA: NEGATIVE
RBC, UA: NEGATIVE
Specific Gravity, UA: 1.02 (ref 1.005–1.030)
Urobilinogen, Ur: 0.2 mg/dL (ref 0.2–1.0)
pH, UA: 5 (ref 5.0–7.5)

## 2020-11-02 NOTE — Progress Notes (Addendum)
History of Present Illness:   3.2.2021: TRUS/Bx.  At that time, PSA was 7.5.  There was a left-sided prostate nodule.  Prostate volume was 81 mL.   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 2 cores (left base lateral, left mid lateral) revealed GS 4+3 pattern, 20/50% of cores involved, respectively. 1 core (left apex lateral) revealed GS 4+4 pattern, 40% of core involved   CT abdomen and pelvis revealed no evidence of extraprostatic disease.  There was diverticulosis of the descending and sigmoid colon with subtle stranding along the descending distal colon suspicious for low-grade diverticulitis.  Suspected diffuse hepatic steatosis.  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  Bilateral renal cysts were noted.  There is a solitary left mid ureteral stone 5 mm in size.   Bone scan revealed no evidence of osseous metastatic disease.   He was referred to [facility] for EBRT.    10.19.2021: Pt completed EBRT on 8.25.2021. He initiated ADT on 4.28.2021 with Mills Koller and received an Eligard injection on 6.8.2021. Pt has experienced hot flashes with ADT.    2.15.2022: Douglas Rasmussen is here today for f/u of his PCa. He has finished his ADT and EBRT therapy at this point and is recovering well. He is experiencing hot-flashes w/ reduced frequency. He continues on tamsulosin and notes some increased urinary frequency. He denies any gross hematuria or significantly worsened LUTS.  8.30.2022: IPSS 5  QoL score 2. ? Blood in urine following recent Rt THA.      Past Medical History:  Diagnosis Date   Arthritis    "qwhere" (01/13/2016)   Cancer [facility])    Prostate   Diabetes mellitus without complication ([place])    Fatty liver    H/O cardiovascular stress test 12/2015   History of kidney stones    last episode 2-3 yrs. ago, but also remarks that he has had cystocopy & lithotripsy    Hypertension    Sleep apnea     Past Surgical History:  Procedure Laterality  Date   CARPAL TUNNEL RELEASE Bilateral 2003-2004   left-right   CATARACT EXTRACTION W/PHACO Right 10/29/2014   Procedure: CATARACT EXTRACTION PHACO AND INTRAOCULAR LENS PLACEMENT RIGHT EYE CDE=6.86;  Surgeon: Tonny Branch, MD;  [facility];  Service: Ophthalmology;  Laterality: Right;   CATARACT EXTRACTION W/PHACO Left 11/26/2014   Procedure: CATARACT EXTRACTION PHACO AND INTRAOCULAR LENS PLACEMENT (IOC);  Surgeon: Tonny Branch, MD;  Location: [facility];  Service: Ophthalmology;  Laterality: Left;  CDE:5.33   COLONOSCOPY     CYSTOSCOPY/RETROGRADE/URETEROSCOPY/STONE EXTRACTION WITH BASKET  X 2   JOINT REPLACEMENT     KIDNEY STONE SURGERY  1984   LITHOTRIPSY     TONSILLECTOMY  1958   TOTAL HIP ARTHROPLASTY Left 2012   TOTAL HIP ARTHROPLASTY Right 09/01/2020   Procedure: TOTAL HIP ARTHROPLASTY ANTERIOR APPROACH;  Surgeon: Gaynelle Arabian, MD;  [facility];  Service: Orthopedics;  Laterality: Right;  19mn   TOTAL SHOULDER ARTHROPLASTY Right 01/13/2016   TOTAL SHOULDER ARTHROPLASTY Right 01/13/2016   Procedure: RIGHT TOTAL SHOULDER ARTHROPLASTY;  Surgeon: KJustice Britain MD;  [place]  Service: Orthopedics;  Laterality: Right;   TOTAL SHOULDER ARTHROPLASTY Left 02/01/2017   Procedure: TOTAL SHOULDER ARTHROPLASTY;  Surgeon: SJustice Britain MD;  [place]  Service: Orthopedics;  Laterality: Left;    Home Medications:  Allergies as of 11/02/2020   No Known Allergies      Medication List  Accurate as of November 02, 2020  8:35 AM. If you have any questions, ask your nurse or doctor.          HYDROcodone-acetaminophen 5-325 MG tablet Commonly known as: NORCO/VICODIN Take 1-2 tablets by mouth every 4 (four) hours as needed for moderate pain.   lisinopril 5 MG tablet Commonly known as: ZESTRIL Take 5 mg by mouth daily.   Lumify 0.025 % Soln Generic drug: Brimonidine Tartrate Place 1 drop into both eyes daily as needed (redness/dryness).   metFORMIN 500 MG  tablet Commonly known as: GLUCOPHAGE Take 500 mg by mouth 2 (two) times daily.   methocarbamol 500 MG tablet Commonly known as: ROBAXIN Take 1 tablet (500 mg total) by mouth every 6 (six) hours as needed for muscle spasms.   rivaroxaban 10 MG Tabs tablet Commonly known as: XARELTO Take 1 tablet (10 mg total) by mouth daily with breakfast. Then take one 81 mg aspirin once a day for three weeks. Then discontinue aspirin.   traMADol 50 MG tablet Commonly known as: ULTRAM Take 1-2 tablets (50-100 mg total) by mouth every 6 (six) hours as needed for moderate pain.   ZzzQuil 25 MG Caps Generic drug: diphenhydrAMINE HCl (Sleep) Take 25 mg by mouth at bedtime as needed (sleep).        Allergies: No Known Allergies  Family History  Problem Relation Age of Onset   Diabetes Mother    Heart disease Mother    Arthritis Mother    Arthritis Father    Alcoholism Father     Social History:  reports that he has never smoked. His smokeless tobacco use includes snuff. He reports that he does not currently use alcohol after a past usage of about 56.0 standard drinks per week. He reports that he does not use drugs.  ROS: A complete review of systems was performed.  All systems are negative except for pertinent findings as noted.  Physical Exam:  Vital signs in last 24 hours: There were no vitals taken for this visit. Constitutional:  Alert and oriented, No acute distress Cardiovascular: Regular rate  Respiratory: Normal respiratory effort GI: Abdomen is soft, nontender, nondistended, no abdominal masses. No CVAT.  Genitourinary: Normal male phallus, testes are descended bilaterally and non-tender and without masses, scrotum is normal in appearance without lesions or masses, perineum is normal on inspection. Lymphatic: No lymphadenopathy Neurologic: Grossly intact, no focal deficits Psychiatric: Normal mood and affect  I have reviewed prior pt notes  I have reviewed urinalysis  results-clear today  I have reviewed prior PSA results-undetectable  I have reviewed prior urine culture   Impression/Assessment:  History of prostate cancer, now over a year out from completion of EBRT with short-term ADT.  Excellent PSA response, no significant sequelae  Plan:  I will see back in 6 months for his next check.

## 2020-11-02 NOTE — Progress Notes (Signed)
Urological Symptom Review  Patient is experiencing the following symptoms: Frequent urination Get up at night to urinate   Review of Systems  Gastrointestinal (upper)  : Negative for upper GI symptoms  Gastrointestinal (lower) : Negative for lower GI symptoms  Constitutional : Fatigue  Skin: Negative for skin symptoms  Eyes: Negative for eye symptoms  Ear/Nose/Throat : Sinus problems  Hematologic/Lymphatic: Negative for Hematologic/Lymphatic symptoms  Cardiovascular : Negative for cardiovascular symptoms  Respiratory : Negative for respiratory symptoms  Endocrine: Negative for endocrine symptoms  Musculoskeletal: Negative for musculoskeletal symptoms  Neurological: Negative for neurological symptoms  Psychologic: Negative for psychiatric symptoms

## 2020-11-04 ENCOUNTER — Telehealth: Payer: Self-pay

## 2020-11-04 ENCOUNTER — Other Ambulatory Visit: Payer: Self-pay | Admitting: Urology

## 2020-11-04 DIAGNOSIS — N138 Other obstructive and reflux uropathy: Secondary | ICD-10-CM

## 2020-11-04 DIAGNOSIS — N401 Enlarged prostate with lower urinary tract symptoms: Secondary | ICD-10-CM

## 2020-11-04 MED ORDER — TAMSULOSIN HCL 0.4 MG PO CAPS: 0.4000 mg | ORAL_CAPSULE | Freq: Every day | ORAL | 3 refills | Status: DC

## 2020-11-10 DIAGNOSIS — N5235 Erectile dysfunction following radiation therapy: Secondary | ICD-10-CM | POA: Diagnosis not present

## 2020-11-10 DIAGNOSIS — Z8546 Personal history of malignant neoplasm of prostate: Secondary | ICD-10-CM | POA: Diagnosis not present

## 2020-11-10 DIAGNOSIS — Z08 Encounter for follow-up examination after completed treatment for malignant neoplasm: Secondary | ICD-10-CM | POA: Diagnosis not present

## 2020-11-10 DIAGNOSIS — Z6830 Body mass index (BMI) 30.0-30.9, adult: Secondary | ICD-10-CM | POA: Diagnosis not present

## 2020-11-10 DIAGNOSIS — N529 Male erectile dysfunction, unspecified: Secondary | ICD-10-CM | POA: Diagnosis not present

## 2020-11-10 DIAGNOSIS — C61 Malignant neoplasm of prostate: Secondary | ICD-10-CM | POA: Diagnosis not present

## 2020-11-10 DIAGNOSIS — Z96649 Presence of unspecified artificial hip joint: Secondary | ICD-10-CM | POA: Diagnosis not present

## 2020-11-16 ENCOUNTER — Other Ambulatory Visit: Payer: Self-pay

## 2020-11-16 DIAGNOSIS — N401 Enlarged prostate with lower urinary tract symptoms: Secondary | ICD-10-CM

## 2020-11-16 MED ORDER — TAMSULOSIN HCL 0.4 MG PO CAPS: 0.4000 mg | ORAL_CAPSULE | Freq: Every day | ORAL | 3 refills | Status: DC

## 2021-01-03 DIAGNOSIS — I10 Essential (primary) hypertension: Secondary | ICD-10-CM | POA: Diagnosis not present

## 2021-01-03 DIAGNOSIS — E7849 Other hyperlipidemia: Secondary | ICD-10-CM | POA: Diagnosis not present

## 2021-01-03 DIAGNOSIS — R7989 Other specified abnormal findings of blood chemistry: Secondary | ICD-10-CM | POA: Diagnosis not present

## 2021-01-03 DIAGNOSIS — E1169 Type 2 diabetes mellitus with other specified complication: Secondary | ICD-10-CM | POA: Diagnosis not present

## 2021-01-03 DIAGNOSIS — E782 Mixed hyperlipidemia: Secondary | ICD-10-CM | POA: Diagnosis not present

## 2021-01-10 DIAGNOSIS — K760 Fatty (change of) liver, not elsewhere classified: Secondary | ICD-10-CM | POA: Diagnosis not present

## 2021-01-10 DIAGNOSIS — I10 Essential (primary) hypertension: Secondary | ICD-10-CM | POA: Diagnosis not present

## 2021-01-10 DIAGNOSIS — R7989 Other specified abnormal findings of blood chemistry: Secondary | ICD-10-CM | POA: Diagnosis not present

## 2021-01-10 DIAGNOSIS — Z23 Encounter for immunization: Secondary | ICD-10-CM | POA: Diagnosis not present

## 2021-01-10 DIAGNOSIS — C61 Malignant neoplasm of prostate: Secondary | ICD-10-CM | POA: Diagnosis not present

## 2021-01-10 DIAGNOSIS — Z6832 Body mass index (BMI) 32.0-32.9, adult: Secondary | ICD-10-CM | POA: Diagnosis not present

## 2021-01-10 DIAGNOSIS — E7849 Other hyperlipidemia: Secondary | ICD-10-CM | POA: Diagnosis not present

## 2021-01-10 DIAGNOSIS — E1169 Type 2 diabetes mellitus with other specified complication: Secondary | ICD-10-CM | POA: Diagnosis not present

## 2021-04-25 DIAGNOSIS — E119 Type 2 diabetes mellitus without complications: Secondary | ICD-10-CM | POA: Diagnosis not present

## 2021-04-25 DIAGNOSIS — H524 Presbyopia: Secondary | ICD-10-CM | POA: Diagnosis not present

## 2021-04-25 DIAGNOSIS — Z7984 Long term (current) use of oral hypoglycemic drugs: Secondary | ICD-10-CM | POA: Diagnosis not present

## 2021-04-25 DIAGNOSIS — Z961 Presence of intraocular lens: Secondary | ICD-10-CM | POA: Diagnosis not present

## 2021-04-25 DIAGNOSIS — H26493 Other secondary cataract, bilateral: Secondary | ICD-10-CM | POA: Diagnosis not present

## 2021-04-25 DIAGNOSIS — H16223 Keratoconjunctivitis sicca, not specified as Sjogren's, bilateral: Secondary | ICD-10-CM | POA: Diagnosis not present

## 2021-04-26 ENCOUNTER — Ambulatory Visit: Payer: Medicare Other | Admitting: Urology

## 2021-05-03 ENCOUNTER — Ambulatory Visit: Payer: Medicare Other | Admitting: Urology

## 2021-05-31 ENCOUNTER — Encounter: Payer: Self-pay | Admitting: Urology

## 2021-05-31 ENCOUNTER — Other Ambulatory Visit: Payer: Self-pay

## 2021-05-31 ENCOUNTER — Ambulatory Visit (INDEPENDENT_AMBULATORY_CARE_PROVIDER_SITE_OTHER): Payer: Medicare Other | Admitting: Urology

## 2021-05-31 VITALS — BP 146/82 | HR 85

## 2021-05-31 DIAGNOSIS — Z8546 Personal history of malignant neoplasm of prostate: Secondary | ICD-10-CM

## 2021-05-31 NOTE — Progress Notes (Signed)
History of Present Illness:  ? ?3.2.2021: TRUS/Bx.  At that time, PSA was 7.5.  There was a left-sided prostate nodule.  Prostate volume was 81 mL. ?  ?3/12 cores came back with adenocarcinoma. ?2 cores (left base lateral, left mid lateral) revealed GS 4+3 pattern, 20/50% of cores involved, respectively. ?1 core (left apex lateral) revealed GS 4+4 pattern, 40% of core involved ?  ?CT abdomen and pelvis revealed no evidence of extraprostatic disease.  There was diverticulosis of the descending and sigmoid colon with subtle stranding along the descending distal colon suspicious for low-grade diverticulitis.  Suspected diffuse hepatic steatosis.  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  Bilateral renal cysts were noted.  There is a solitary left mid ureteral stone 5 mm in size. ?  ?Bone scan revealed no evidence of osseous metastatic disease. ?  ?He was referred to [facility] for EBRT.  ? ?4.28.2021: ADT initiated with Firmagon 240 mg ? ?6.8.2021: He received 30 mg Eligard injection ? ?8.25.2021: He completed EBRT ? ?10.19.2021: He received his last Eligard injection ? ?3.28.2023: Last PSA checked in August 2022 was < 0.1. No blood in urine or stool. No significant LUTS. Not on tamsulosin. ? ? ?Past Medical History:  ?Diagnosis Date  ? Arthritis   ? "qwhere" (01/13/2016)  ? Cancer [facility])   ? Prostate  ? Diabetes mellitus without complication ([place])   ? Fatty liver   ? H/O cardiovascular stress test 12/2015  ? History of kidney stones   ? last episode 2-3 yrs. ago, but also remarks that he has had cystocopy & lithotripsy   ? Hypertension   ? Sleep apnea   ? ? ?Past Surgical History:  ?Procedure Laterality Date  ? CARPAL TUNNEL RELEASE Bilateral 2003-2004  ? left-right  ? CATARACT EXTRACTION W/PHACO Right 10/29/2014  ? Procedure: CATARACT EXTRACTION PHACO AND INTRAOCULAR LENS PLACEMENT RIGHT EYE CDE=6.86;  Surgeon: Tonny Branch, MD;  [facility];  Service: Ophthalmology;   Laterality: Right;  ? CATARACT EXTRACTION W/PHACO Left 11/26/2014  ? Procedure: CATARACT EXTRACTION PHACO AND INTRAOCULAR LENS PLACEMENT (IOC);  Surgeon: Tonny Branch, MD;  [facility];  Service: Ophthalmology;  Laterality: Left;  CDE:5.33  ? COLONOSCOPY    ? CYSTOSCOPY/RETROGRADE/URETEROSCOPY/STONE EXTRACTION WITH BASKET  X 2  ? JOINT REPLACEMENT    ? [place]  ? LITHOTRIPSY    ? TONSILLECTOMY  1958  ? TOTAL HIP ARTHROPLASTY Left 2012  ? TOTAL HIP ARTHROPLASTY Right 09/01/2020  ? Procedure: TOTAL HIP ARTHROPLASTY ANTERIOR APPROACH;  Surgeon: Gaynelle Arabian, MD;  [facility];  Service: Orthopedics;  Laterality: Right;  135mn  ? TOTAL SHOULDER ARTHROPLASTY Right 01/13/2016  ? TOTAL SHOULDER ARTHROPLASTY Right 01/13/2016  ? Procedure: RIGHT TOTAL SHOULDER ARTHROPLASTY;  Surgeon: KJustice Britain MD;  [place]  Service: Orthopedics;  Laterality: Right;  ? TOTAL SHOULDER ARTHROPLASTY Left 02/01/2017  ? Procedure: TOTAL SHOULDER ARTHROPLASTY;  Surgeon: SJustice Britain MD;  [place]  Service: Orthopedics;  Laterality: Left;  ? ? ?Home Medications:  ?Allergies as of 05/31/2021   ?No Known Allergies ?  ? ?  ?Medication List  ?  ? ?  ? Accurate as of May 31, 2021  8:24 AM. If you have any questions, ask your nurse or doctor.  ?  ?  ? ?  ? ?Lumify 0.025 % Soln ?Generic drug: Brimonidine Tartrate ?Place 1 drop into both eyes daily as needed (redness/dryness). ?  ?tamsulosin 0.4  MG Caps capsule ?Commonly known as: FLOMAX ?Take 1 capsule (0.4 mg total) by mouth daily after supper. ?  ?ZzzQuil 25 MG Caps ?Generic drug: diphenhydrAMINE HCl (Sleep) ?Take 25 mg by mouth at bedtime as needed (sleep). ?  ? ?  ? ? ?Allergies: No Known Allergies ? ?Family History  ?Problem Relation Age of Onset  ? Diabetes Mother   ? Heart disease Mother   ? Arthritis Mother   ? Arthritis Father   ? Alcoholism Father   ? ? ?Social History:  reports that he has never smoked. His smokeless tobacco use includes snuff.  He reports that he does not currently use alcohol after a past usage of about 56.0 standard drinks per week. He reports that he does not use drugs. ? ?ROS: ?A complete review of systems was performed.  All systems are negative except for pertinent findings as noted. ? ?Physical Exam:  ?Vital signs in last 24 hours: ?There were no vitals taken for this visit. ?Constitutional:  Alert and oriented, No acute distress ?Cardiovascular: Regular rate  ?Respiratory: Normal respiratory effort ?GI: Abdomen is soft, nontender, nondistended, no abdominal masses. No CVAT.  Obese, no hernias. ?Genitourinary: Normal male phallus, testes are descended bilaterally and non-tender and without masses, scrotum is normal in appearance without lesions or masses, perineum is normal on inspection.  Prostate smooth, flat, no nodularity. ?Lymphatic: No lymphadenopathy ?Neurologic: Grossly intact, no focal deficits ?Psychiatric: Normal mood and affect ? ?I have reviewed prior pt notes ? ?I have reviewed prior PSA results ? ?I have reviewed prior urine culture ? ? ?Impression/Assessment:  ?Grade group 4 prostate cancer, status post ADT/EBRT in 2021.  He is doing well with no significant urinary issues, most recent PSA several months ago was undetectable ? ?Plan:  ?We will draw his PSA today ? ?I will see back in about 9 months. ? ?

## 2021-06-01 LAB — PSA: Prostate Specific Ag, Serum: <0.1 ng/mL (ref 0.0–4.0)

## 2021-06-07 NOTE — Progress Notes (Signed)
Letter sent.

## 2021-07-04 DIAGNOSIS — E7849 Other hyperlipidemia: Secondary | ICD-10-CM | POA: Diagnosis not present

## 2021-07-04 DIAGNOSIS — E1169 Type 2 diabetes mellitus with other specified complication: Secondary | ICD-10-CM | POA: Diagnosis not present

## 2021-07-04 DIAGNOSIS — I10 Essential (primary) hypertension: Secondary | ICD-10-CM | POA: Diagnosis not present

## 2021-07-04 DIAGNOSIS — E782 Mixed hyperlipidemia: Secondary | ICD-10-CM | POA: Diagnosis not present

## 2021-07-04 DIAGNOSIS — Z1329 Encounter for screening for other suspected endocrine disorder: Secondary | ICD-10-CM | POA: Diagnosis not present

## 2021-07-11 DIAGNOSIS — I10 Essential (primary) hypertension: Secondary | ICD-10-CM | POA: Diagnosis not present

## 2021-07-11 DIAGNOSIS — K760 Fatty (change of) liver, not elsewhere classified: Secondary | ICD-10-CM | POA: Diagnosis not present

## 2021-07-11 DIAGNOSIS — Z1389 Encounter for screening for other disorder: Secondary | ICD-10-CM | POA: Diagnosis not present

## 2021-07-11 DIAGNOSIS — Z0001 Encounter for general adult medical examination with abnormal findings: Secondary | ICD-10-CM | POA: Diagnosis not present

## 2021-07-11 DIAGNOSIS — Z1331 Encounter for screening for depression: Secondary | ICD-10-CM | POA: Diagnosis not present

## 2021-07-11 DIAGNOSIS — C61 Malignant neoplasm of prostate: Secondary | ICD-10-CM | POA: Diagnosis not present

## 2021-07-11 DIAGNOSIS — R200 Anesthesia of skin: Secondary | ICD-10-CM | POA: Diagnosis not present

## 2021-07-11 DIAGNOSIS — E1169 Type 2 diabetes mellitus with other specified complication: Secondary | ICD-10-CM | POA: Diagnosis not present

## 2021-07-17 IMAGING — XA DG MYELOGRAPHY LUMBAR INJ LUMBOSACRAL
13 of 17 series · 13 of 17 positions shown · non-contrast
Comparison: CT abdomen pelvis dated July 20, 2018.

CLINICAL DATA: Chronic severe right-sided low back pain radiating
into the right hip, anterior thigh, and medial calf.
TECHNIQUE: Contiguous axial images were obtained through the lumbar spine after
the intrathecal infusion of contrast. Coronal and sagittal
reconstructions were obtained of the axial image sets.

[Series 1: vasc adipose · 1 of 1 slices shown (1 of 11)]
[im 1/1]
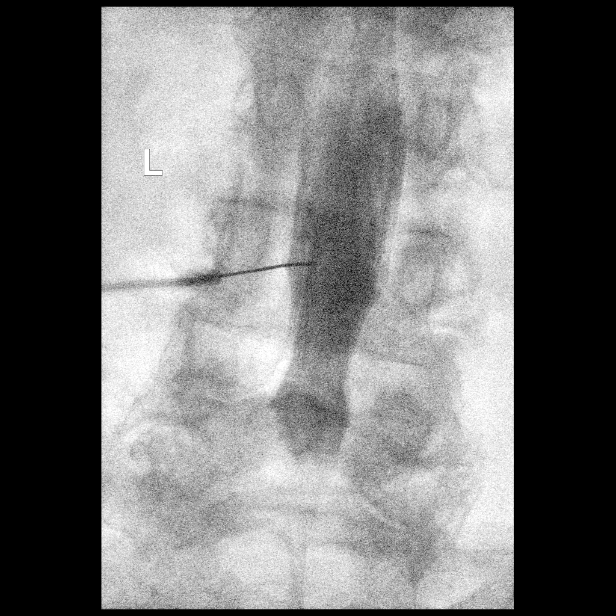

[Series 1: w lumbar spine lat · 0.15mm/px · 1 of 1 slices shown]
[im 1/1]
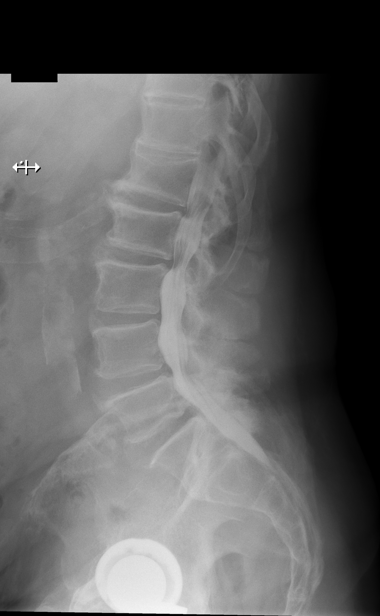

[Series 2: vasc adipose · 1 of 1 slices shown (2 of 11)]
[im 1/1]
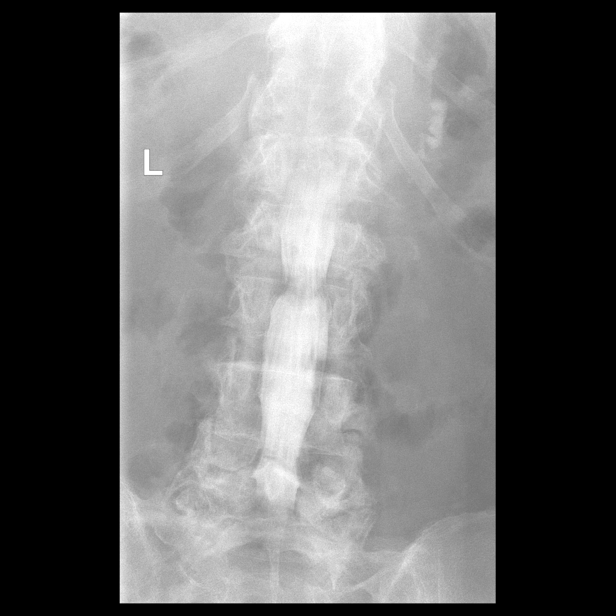

[Series 3: w lumbar spine extension · 0.15mm/px · 1 of 1 slices shown]
[im 1/1]
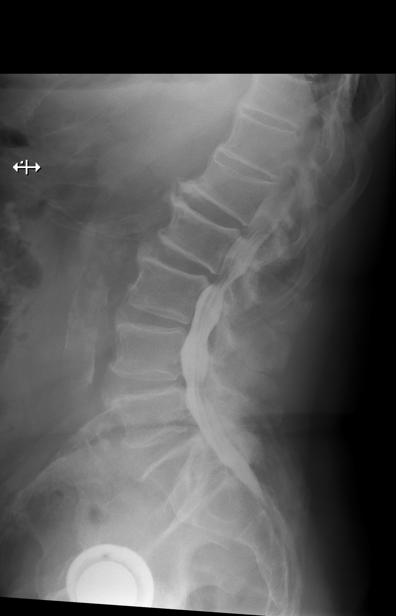

[Series 3: vasc adipose · 1 of 1 slices shown (3 of 11)]
[im 1/1]
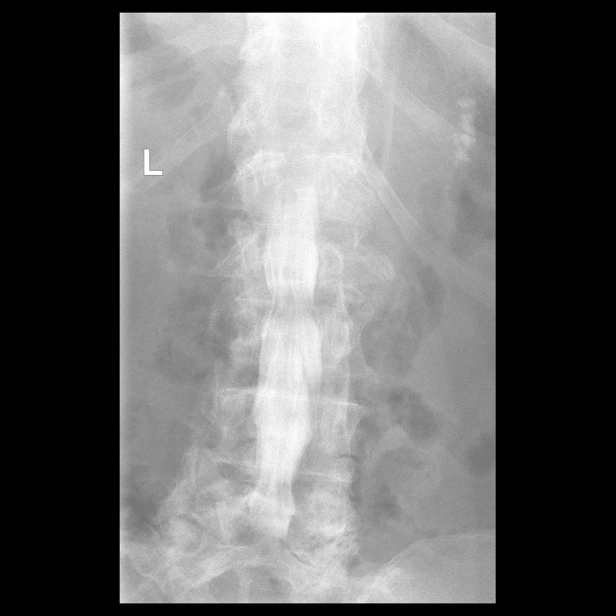

[Series 5: vasc adipose · 1 of 1 slices shown (4 of 11)]
[im 1/1]
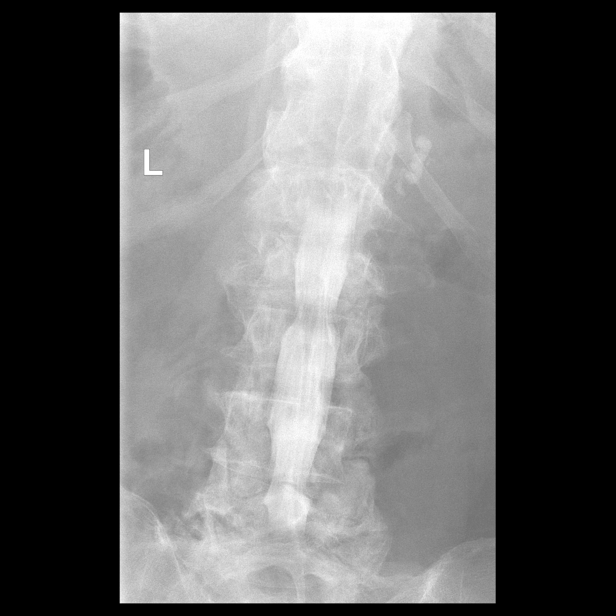

[Series 6: vasc adipose · 1 of 1 slices shown (5 of 11)]
[im 1/1]
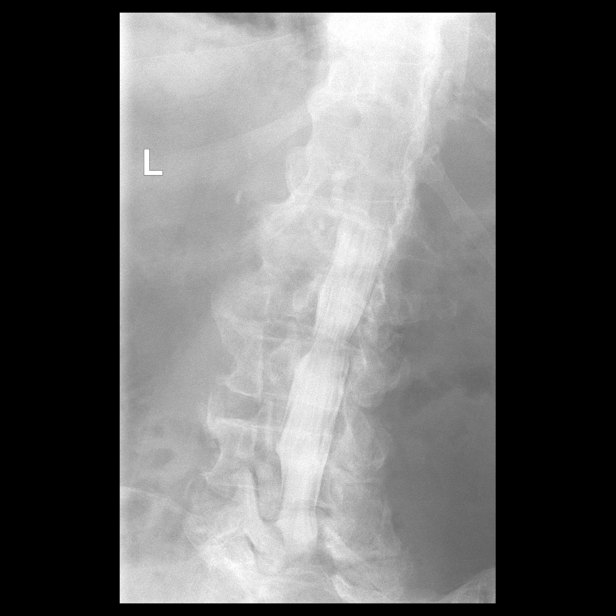

[Series 7: vasc adipose · 1 of 1 slices shown (6 of 11)]
[im 1/1]
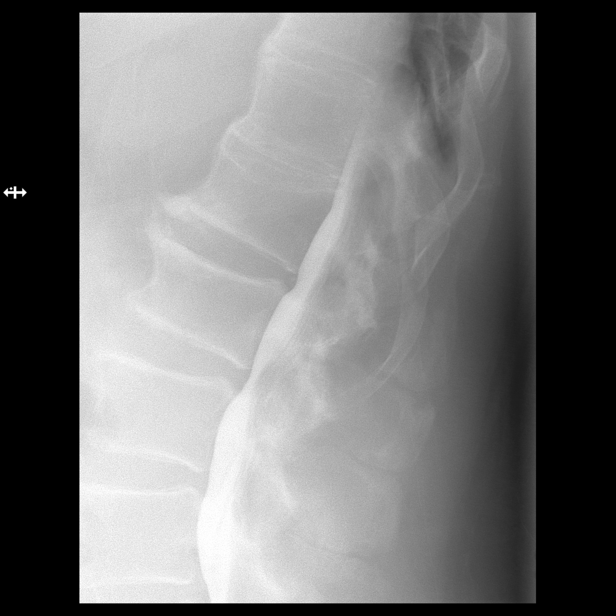

[Series 9: vasc adipose · 1 of 1 slices shown (7 of 11)]
[im 1/1]
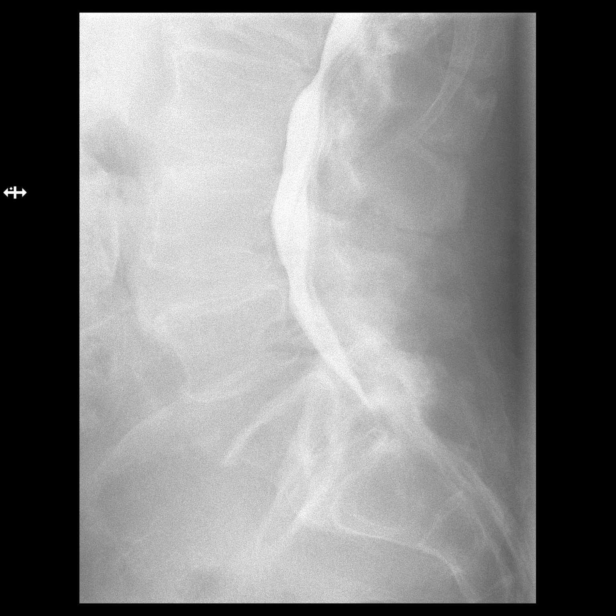

[Series 10: vasc adipose · 1 of 1 slices shown (8 of 11)]
[im 1/1]
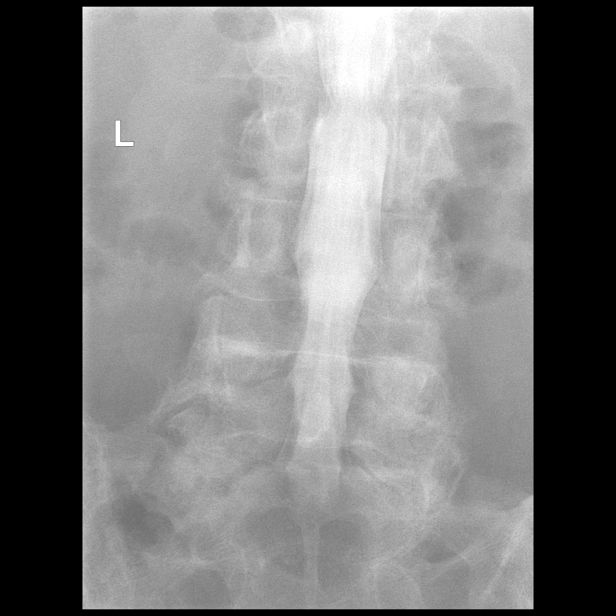

[Series 11: vasc adipose · 1 of 1 slices shown (9 of 11)]
[im 1/1]
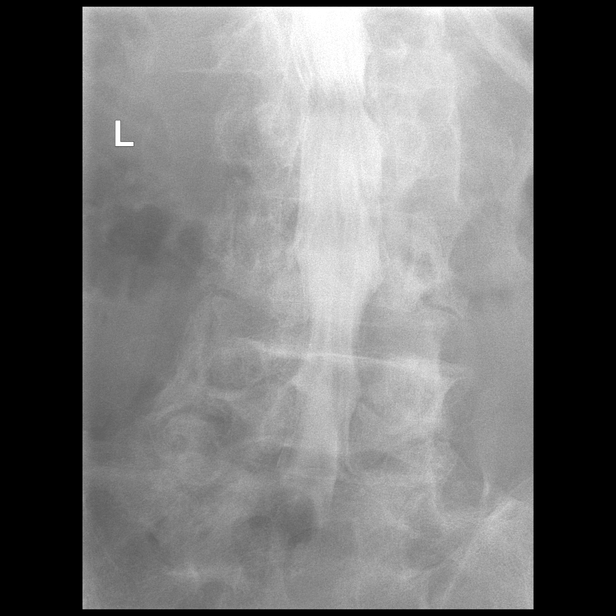

[Series 13: vasc adipose · 1 of 1 slices shown (10 of 11)]
[im 1/1]
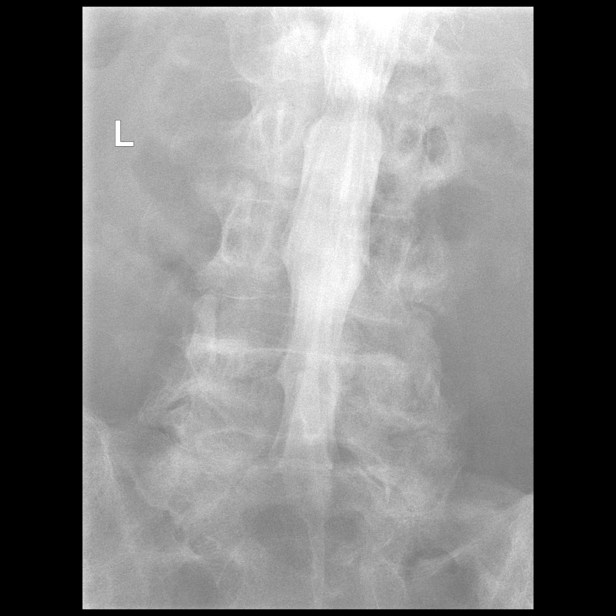

[Series 14: vasc adipose · 1 of 1 slices shown (11 of 11)]
[im 1/1]
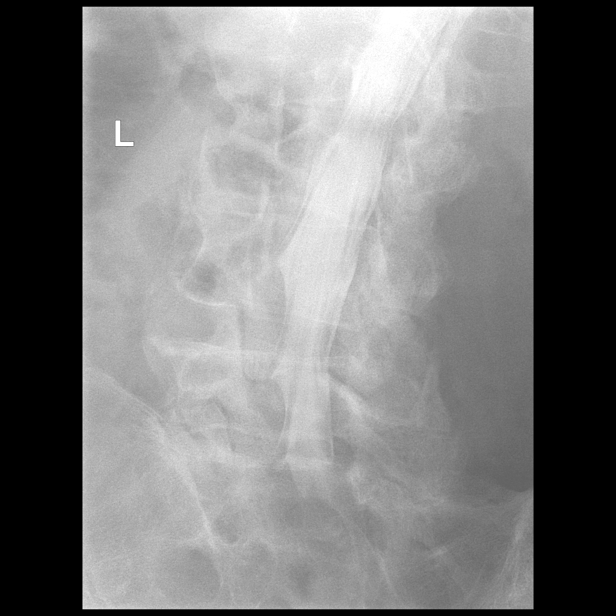

[13 of 17 positions shown; findings below may reference images not displayed]

EXAM:
LUMBAR MYELOGRAM

CT LUMBAR MYELOGRAM

FLUOROSCOPY TIME:  Radiation Exposure Index (as provided by the
fluoroscopic device): 17.6 mGy

Fluoroscopy Time:  22 seconds

Number of Acquired Images:  16

PROCEDURE:
After thorough discussion of risks and benefits of the procedure
including bleeding, infection, injury to nerves, blood vessels,
adjacent structures as well as headache and CSF leak, written and
oral informed consent was obtained. Consent was obtained by Dr.
Shimikiro Epitas Donongo. Time out form was completed.

Patient was positioned prone on the fluoroscopy table. Local
anesthesia was provided with 1% lidocaine without epinephrine after
prepped and draped in the usual sterile fashion. Puncture was
performed at L3-L4 using a 5 inch 22-gauge spinal needle via left
interlaminar approach. Using a single pass through the dura, the
needle was placed within the thecal sac, with return of clear CSF.
15 mL of Isovue H-[id number] was injected into the thecal sac, with normal
opacification of the nerve roots and cauda equina consistent with
free flow within the subarachnoid space.

I personally performed the lumbar puncture and administered the
intrathecal contrast. I also personally supervised acquisition of
the myelogram images.
FINDINGS: LUMBAR MYELOGRAM FINDINGS:

Chronic L1 compression deformity. Trace anterolisthesis at L4-L5 and
L5-S1. No dynamic instability. Small ventral extradural defects at
L1-L2, L2-L3, and L4-L5. Mild spinal canal stenosis at L2-L3 and
L4-L5 worsens with standing and extension, but improves with
flexion.

CT LUMBAR MYELOGRAM FINDINGS:

Segmentation: Partial lumbarization of S1.

Alignment: Trace anterolisthesis at L4-L5 and L5-S1.

Vertebrae: No acute fracture or other focal pathologic process.
Chronic mild L1 superior endplate compression deformity.

Conus medullaris and cauda equina: Conus extends to the L1-L2 level.
Conus and cauda equina appear normal.

Paraspinal and other soft tissues: Hepatic steatosis. Unchanged
small left adrenal myelolipoma. Aortoiliac atherosclerotic vascular
disease.

Disc levels:

T11-T12: Negative disc. Bridging osteophytes on the right. Mild
right neuroforaminal stenosis. No spinal canal or left
neuroforaminal stenosis.

T12-L1: Negative disc. Bridging osteophytes. Partial ankylosis of
the facet joints. Mild right neuroforaminal stenosis. No spinal
canal or left neuroforaminal stenosis.

L1-L2: Mild disc bulging. Moderate right and mild left facet
arthropathy. Borderline mild spinal canal and bilateral
neuroforaminal stenosis.

L2-L3: Mild disc bulging. Moderate to severe bilateral facet
arthropathy with ligamentum flavum hypertrophy. Mild-to-moderate
spinal canal stenosis. Moderate bilateral neuroforaminal stenosis.

L3-L4: Mild disc bulging. Moderate to severe bilateral facet
arthropathy. Mild left neuroforaminal stenosis. No spinal canal or
right neuroforaminal stenosis.

L4-L5: Mild disc bulging. Severe bilateral facet arthropathy with
ligamentum flavum hypertrophy. Small focus of air within the right
neural foramen, favored to reflect a synovial cyst and less likely
disc given the presence of air within the left facet joint and the
lack of vacuum phenomenon within the L4-L5 disc. Mild spinal canal
stenosis. Severe right and moderate left neuroforaminal stenosis.

L5-S1: Mild disc bulging. Severe bilateral facet arthropathy.
Moderate bilateral neuroforaminal stenosis. No spinal canal
stenosis.
IMPRESSION: 1. Mild degenerative disc disease and advanced facet arthropathy
throughout the lumbar spine as described above. Severe right
neuroforaminal stenosis at L4-L5 with small focus of air within the
neural foramen, favored to reflect a synovial cyst and less likely a
disc fragment given the presence of air within the left facet joint
and the lack of vacuum phenomenon within the L4-L5 disc. There is
likely impingement of the exiting right L4 nerve root.
2. Mild-to-moderate L2-L3 and mild L4-L5 spinal canal stenosis
worsen with standing and extension.
3. Moderate bilateral neuroforaminal stenosis at L2-L3 and L5-S1.
4. Chronic L1 compression deformity.

## 2021-07-18 DIAGNOSIS — H35372 Puckering of macula, left eye: Secondary | ICD-10-CM | POA: Diagnosis not present

## 2021-07-18 DIAGNOSIS — H01002 Unspecified blepharitis right lower eyelid: Secondary | ICD-10-CM | POA: Diagnosis not present

## 2021-07-18 DIAGNOSIS — H01001 Unspecified blepharitis right upper eyelid: Secondary | ICD-10-CM | POA: Diagnosis not present

## 2021-07-18 DIAGNOSIS — H26491 Other secondary cataract, right eye: Secondary | ICD-10-CM | POA: Diagnosis not present

## 2021-11-16 DIAGNOSIS — Z6831 Body mass index (BMI) 31.0-31.9, adult: Secondary | ICD-10-CM | POA: Diagnosis not present

## 2021-11-16 DIAGNOSIS — C61 Malignant neoplasm of prostate: Secondary | ICD-10-CM | POA: Diagnosis not present

## 2021-11-16 DIAGNOSIS — Z86010 Personal history of colonic polyps: Secondary | ICD-10-CM | POA: Diagnosis not present

## 2021-12-17 DIAGNOSIS — Z23 Encounter for immunization: Secondary | ICD-10-CM | POA: Diagnosis not present

## 2022-01-02 DIAGNOSIS — I10 Essential (primary) hypertension: Secondary | ICD-10-CM | POA: Diagnosis not present

## 2022-01-02 DIAGNOSIS — E7801 Familial hypercholesterolemia: Secondary | ICD-10-CM | POA: Diagnosis not present

## 2022-01-02 DIAGNOSIS — E7849 Other hyperlipidemia: Secondary | ICD-10-CM | POA: Diagnosis not present

## 2022-01-02 DIAGNOSIS — E1169 Type 2 diabetes mellitus with other specified complication: Secondary | ICD-10-CM | POA: Diagnosis not present

## 2022-01-02 DIAGNOSIS — R7989 Other specified abnormal findings of blood chemistry: Secondary | ICD-10-CM | POA: Diagnosis not present

## 2022-01-02 DIAGNOSIS — E7800 Pure hypercholesterolemia, unspecified: Secondary | ICD-10-CM | POA: Diagnosis not present

## 2022-01-02 DIAGNOSIS — E782 Mixed hyperlipidemia: Secondary | ICD-10-CM | POA: Diagnosis not present

## 2022-01-16 DIAGNOSIS — M654 Radial styloid tenosynovitis [de Quervain]: Secondary | ICD-10-CM | POA: Diagnosis not present

## 2022-01-16 DIAGNOSIS — M13849 Other specified arthritis, unspecified hand: Secondary | ICD-10-CM | POA: Diagnosis not present

## 2022-01-16 DIAGNOSIS — M79641 Pain in right hand: Secondary | ICD-10-CM | POA: Diagnosis not present

## 2022-01-16 DIAGNOSIS — M79642 Pain in left hand: Secondary | ICD-10-CM | POA: Diagnosis not present

## 2022-02-13 NOTE — Progress Notes (Incomplete)
History of Present Illness:   3.2.2021: TRUS/Bx.  At that time, PSA was 7.5.  There was a left-sided prostate nodule.  Prostate volume was 81 mL.   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 2 cores (left base lateral, left mid lateral) revealed GS 4+3 pattern, 20/50% of cores involved, respectively. 1 core (left apex lateral) revealed GS 4+4 pattern, 40% of core involved   CT abdomen and pelvis revealed no evidence of extraprostatic disease.  There was diverticulosis of the descending and sigmoid colon with subtle stranding along the descending distal colon suspicious for low-grade diverticulitis.  Suspected diffuse hepatic steatosis.  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  Bilateral renal cysts were noted.  There is a solitary left mid ureteral stone 5 mm in size.   Bone scan revealed no evidence of osseous metastatic disease.   He was referred to [facility] for EBRT.    4.28.2021: ADT initiated with Firmagon 240 mg  6.8.2021: He received 30 mg Eligard injection  8.25.2021: He completed EBRT   10.19.2021: He received his last Eligard injection  Past Medical History:  Diagnosis Date   Arthritis    "qwhere" (01/13/2016)   Cancer ([place])    Prostate   Diabetes mellitus without complication ([place])    Fatty liver    H/O cardiovascular stress test 12/2015   History of kidney stones    last episode 2-3 yrs. ago, but also remarks that he has had cystocopy & lithotripsy    Hypertension    Sleep apnea     Past Surgical History:  Procedure Laterality Date   CARPAL TUNNEL RELEASE Bilateral 2003-2004   left-right   CATARACT EXTRACTION W/PHACO Right 10/29/2014   Procedure: CATARACT EXTRACTION PHACO AND INTRAOCULAR LENS PLACEMENT RIGHT EYE CDE=6.86;  Surgeon: Tonny Branch, MD;  [facility];  Service: Ophthalmology;  Laterality: Right;   CATARACT EXTRACTION W/PHACO Left 11/26/2014   Procedure: CATARACT EXTRACTION PHACO AND INTRAOCULAR LENS  PLACEMENT (IOC);  Surgeon: Tonny Branch, MD;  Location: [facility];  Service: Ophthalmology;  Laterality: Left;  CDE:5.33   COLONOSCOPY     CYSTOSCOPY/RETROGRADE/URETEROSCOPY/STONE EXTRACTION WITH BASKET  X 2   JOINT REPLACEMENT     KIDNEY STONE SURGERY  1984   LITHOTRIPSY     TONSILLECTOMY  1958   TOTAL HIP ARTHROPLASTY Left 2012   TOTAL HIP ARTHROPLASTY Right 09/01/2020   Procedure: TOTAL HIP ARTHROPLASTY ANTERIOR APPROACH;  Surgeon: Gaynelle Arabian, MD;  [facility];  Service: Orthopedics;  Laterality: Right;  180mn   TOTAL SHOULDER ARTHROPLASTY Right 01/13/2016   TOTAL SHOULDER ARTHROPLASTY Right 01/13/2016   Procedure: RIGHT TOTAL SHOULDER ARTHROPLASTY;  Surgeon: KJustice Britain MD;  [place]  Service: Orthopedics;  Laterality: Right;   TOTAL SHOULDER ARTHROPLASTY Left 02/01/2017   Procedure: TOTAL SHOULDER ARTHROPLASTY;  Surgeon: SJustice Britain MD;  [place]  Service: Orthopedics;  Laterality: Left;    Home Medications:  Allergies as of 02/14/2022   No Known Allergies      Medication List        Accurate as of February 13, 2022  8:49 AM. If you have any questions, ask your nurse or doctor.          Lumify 0.025 % Soln Generic drug: Brimonidine Tartrate Place 1 drop into both eyes daily as needed (redness/dryness).   metFORMIN 500 MG tablet Commonly known as: GLUCOPHAGE Take 500 mg by mouth 2 (two) times daily.   ZzzQuil 25 MG Caps Generic  drug: diphenhydrAMINE HCl (Sleep) Take 25 mg by mouth at bedtime as needed (sleep).        Allergies: No Known Allergies  Family History  Problem Relation Age of Onset   Diabetes Mother    Heart disease Mother    Arthritis Mother    Arthritis Father    Alcoholism Father     Social History:  reports that he has never smoked. His smokeless tobacco use includes snuff. He reports that he does not currently use alcohol after a past usage of about 56.0 standard drinks of alcohol per week. He reports that he does  not use drugs.  ROS: A complete review of systems was performed.  All systems are negative except for pertinent findings as noted.  Physical Exam:  Vital signs in last 24 hours: There were no vitals taken for this visit. Constitutional:  Alert and oriented, No acute distress Cardiovascular: Regular rate  Respiratory: Normal respiratory effort GI: Abdomen is soft, nontender, nondistended, no abdominal masses. No CVAT.  Genitourinary: Normal male phallus, testes are descended bilaterally and non-tender and without masses, scrotum is normal in appearance without lesions or masses, perineum is normal on inspection. Lymphatic: No lymphadenopathy Neurologic: Grossly intact, no focal deficits Psychiatric: Normal mood and affect  I have reviewed prior pt notes  I have reviewed notes from referring/previous physicians  I have reviewed urinalysis results  I have independently reviewed prior imaging  I have reviewed prior PSA results  I have reviewed prior urine culture   Impression/Assessment:  ***  Plan:  ***

## 2022-02-14 ENCOUNTER — Ambulatory Visit: Payer: Medicare Other | Admitting: Urology

## 2022-03-14 DIAGNOSIS — R059 Cough, unspecified: Secondary | ICD-10-CM | POA: Diagnosis not present

## 2022-03-14 DIAGNOSIS — Z20828 Contact with and (suspected) exposure to other viral communicable diseases: Secondary | ICD-10-CM | POA: Diagnosis not present

## 2022-03-14 DIAGNOSIS — R030 Elevated blood-pressure reading, without diagnosis of hypertension: Secondary | ICD-10-CM | POA: Diagnosis not present

## 2022-03-14 DIAGNOSIS — J40 Bronchitis, not specified as acute or chronic: Secondary | ICD-10-CM | POA: Diagnosis not present

## 2022-03-14 DIAGNOSIS — Z6833 Body mass index (BMI) 33.0-33.9, adult: Secondary | ICD-10-CM | POA: Diagnosis not present

## 2022-03-21 ENCOUNTER — Ambulatory Visit (INDEPENDENT_AMBULATORY_CARE_PROVIDER_SITE_OTHER): Payer: Medicare Other | Admitting: Urology

## 2022-03-21 ENCOUNTER — Encounter: Payer: Self-pay | Admitting: Urology

## 2022-03-21 VITALS — BP 136/84 | HR 73

## 2022-03-21 DIAGNOSIS — Z8546 Personal history of malignant neoplasm of prostate: Secondary | ICD-10-CM

## 2022-03-21 DIAGNOSIS — N401 Enlarged prostate with lower urinary tract symptoms: Secondary | ICD-10-CM | POA: Diagnosis not present

## 2022-03-21 DIAGNOSIS — N138 Other obstructive and reflux uropathy: Secondary | ICD-10-CM

## 2022-03-21 NOTE — Progress Notes (Signed)
History of Present Illness:   3.2.2021: TRUS/Bx.  At that time, PSA was 7.5.  There was a left-sided prostate nodule.  Prostate volume was 81 mL.   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 2 cores (left base lateral, left mid lateral) revealed GS 4+3 pattern, 20/50% of cores involved, respectively. 1 core (left apex lateral) revealed GS 4+4 pattern, 40% of core involved   CT abdomen and pelvis revealed no evidence of extraprostatic disease.  There was diverticulosis of the descending and sigmoid colon with subtle stranding along the descending distal colon suspicious for low-grade diverticulitis.  Suspected diffuse hepatic steatosis.  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  Bilateral renal cysts were noted.  There is a solitary left mid ureteral stone 5 mm in size.   Bone scan revealed no evidence of osseous metastatic disease.   He was referred to [facility] for EBRT.    10.19.2021: Pt completed EBRT on 8.25.2021. He initiated ADT on 4.28.2021 with Firmagon and completed ST ADT with a 4 mo Eligard injection on this date.   1.16.2024: Last PSA < 0.1 on 3.28.2023.  He denies any urologic issues since his last visit.  PSA in September was 0.09.  He denies gross hematuria or hematochezia.  Urinary symptoms minimal on tamsulosin.     Past Medical History:  Diagnosis Date   Arthritis    "qwhere" (01/13/2016)   Cancer [facility])    Prostate   Diabetes mellitus without complication ([place])    Fatty liver    H/O cardiovascular stress test 12/2015   History of kidney stones    last episode 2-3 yrs. ago, but also remarks that he has had cystocopy & lithotripsy    Hypertension    Sleep apnea     Past Surgical History:  Procedure Laterality Date   CARPAL TUNNEL RELEASE Bilateral 2003-2004   left-right   CATARACT EXTRACTION W/PHACO Right 10/29/2014   Procedure: CATARACT EXTRACTION PHACO AND INTRAOCULAR LENS PLACEMENT RIGHT EYE CDE=6.86;  Surgeon:  Tonny Branch, MD;  Location: [facility];  Service: Ophthalmology;  Laterality: Right;   CATARACT EXTRACTION W/PHACO Left 11/26/2014   Procedure: CATARACT EXTRACTION PHACO AND INTRAOCULAR LENS PLACEMENT (IOC);  Surgeon: Tonny Branch, MD;  Location: [facility];  Service: Ophthalmology;  Laterality: Left;  CDE:5.33   COLONOSCOPY     CYSTOSCOPY/RETROGRADE/URETEROSCOPY/STONE EXTRACTION WITH BASKET  X 2   JOINT REPLACEMENT     KIDNEY STONE SURGERY  1984   LITHOTRIPSY     TONSILLECTOMY  1958   TOTAL HIP ARTHROPLASTY Left 2012   TOTAL HIP ARTHROPLASTY Right 09/01/2020   Procedure: TOTAL HIP ARTHROPLASTY ANTERIOR APPROACH;  Surgeon: Gaynelle Arabian, MD;  Location: [facility];  Service: Orthopedics;  Laterality: Right;  159mn   TOTAL SHOULDER ARTHROPLASTY Right 01/13/2016   TOTAL SHOULDER ARTHROPLASTY Right 01/13/2016   Procedure: RIGHT TOTAL SHOULDER ARTHROPLASTY;  Surgeon: KJustice Britain MD;  [place]  Service: Orthopedics;  Laterality: Right;   TOTAL SHOULDER ARTHROPLASTY Left 02/01/2017   Procedure: TOTAL SHOULDER ARTHROPLASTY;  Surgeon: SJustice Britain MD;  [place]  Service: Orthopedics;  Laterality: Left;    Home Medications:  Allergies as of 03/21/2022   No Known Allergies      Medication List        Accurate as of March 21, 2022  7:13 AM. If you have any questions, ask your nurse or doctor.          Lumify 0.025 % Soln Generic drug:  Brimonidine Tartrate Place 1 drop into both eyes daily as needed (redness/dryness).   metFORMIN 500 MG tablet Commonly known as: GLUCOPHAGE Take 500 mg by mouth 2 (two) times daily.   ZzzQuil 25 MG Caps Generic drug: diphenhydrAMINE HCl (Sleep) Take 25 mg by mouth at bedtime as needed (sleep).        Allergies: No Known Allergies  Family History  Problem Relation Age of Onset   Diabetes Mother    Heart disease Mother    Arthritis Mother    Arthritis Father    Alcoholism Father     Social History:  reports that he has never smoked.  His smokeless tobacco use includes snuff. He reports that he does not currently use alcohol after a past usage of about 56.0 standard drinks of alcohol per week. He reports that he does not use drugs.  ROS: A complete review of systems was performed.  All systems are negative except for pertinent findings as noted.  Physical Exam:  Vital signs in last 24 hours: There were no vitals taken for this visit. Constitutional:  Alert and oriented, No acute distress Cardiovascular: Regular rate  Respiratory: Normal respiratory effor Genitourinary: Normal anal sphincter tone.  Prostate 20 g, symmetric, nonnodular, nontender. Psychiatric: Normal mood and affect  I have reviewed prior pt notes  I have reviewed notes from referring/previous physicians.-Dr. Morse's note  I have reviewed urinalysis results--clear  I have independently reviewed prior imaging-prostate ultrasound volume  I have reviewed prior PSA results  I have reviewed pathology results   Impression/Assessment:  Grade group 4 prostate cancer, status post short-term ADT as well as EBRT, now almost 3 years out with excellent, durable PSA response  BPH with symptoms, doing well on tamsulosin  Plan:  I will check his PSA today  Office visit 6 months

## 2022-03-21 NOTE — Addendum Note (Signed)
Addended by: Franchot Gallo on: 03/21/2022 01:30 PM   Modules accepted: Level of Service

## 2022-03-22 LAB — MICROSCOPIC EXAMINATION: Bacteria, UA: NONE SEEN

## 2022-03-22 LAB — PSA: Prostate Specific Ag, Serum: <0.1 ng/mL (ref 0.0–4.0)

## 2022-03-22 LAB — URINALYSIS, ROUTINE W REFLEX MICROSCOPIC
Bilirubin, UA: NEGATIVE
Glucose, UA: NEGATIVE
Ketones, UA: NEGATIVE
Leukocytes,UA: NEGATIVE
Nitrite, UA: NEGATIVE
Protein,UA: NEGATIVE
Specific Gravity, UA: 1.01 (ref 1.005–1.030)
Urobilinogen, Ur: 0.2 mg/dL (ref 0.2–1.0)
pH, UA: 5.5 (ref 5.0–7.5)

## 2022-03-23 ENCOUNTER — Telehealth: Payer: Self-pay

## 2022-03-23 NOTE — Telephone Encounter (Signed)
Made patient aware that his PSA is still 0, patient voiced understanding

## 2022-03-23 NOTE — Telephone Encounter (Signed)
-----  Message from Franchot Gallo, MD sent at 03/23/2022 11:14 AM EST ----- Notify pt psa stlll 0 ----- Message ----- From: Sherrilyn Rist, CMA Sent: 03/22/2022   8:35 AM EST To: Franchot Gallo, MD  Please review

## 2022-07-10 DIAGNOSIS — R5383 Other fatigue: Secondary | ICD-10-CM | POA: Diagnosis not present

## 2022-07-10 DIAGNOSIS — E782 Mixed hyperlipidemia: Secondary | ICD-10-CM | POA: Diagnosis not present

## 2022-07-10 DIAGNOSIS — I10 Essential (primary) hypertension: Secondary | ICD-10-CM | POA: Diagnosis not present

## 2022-07-10 DIAGNOSIS — E7849 Other hyperlipidemia: Secondary | ICD-10-CM | POA: Diagnosis not present

## 2022-07-10 DIAGNOSIS — Z0001 Encounter for general adult medical examination with abnormal findings: Secondary | ICD-10-CM | POA: Diagnosis not present

## 2022-07-10 DIAGNOSIS — E1169 Type 2 diabetes mellitus with other specified complication: Secondary | ICD-10-CM | POA: Diagnosis not present

## 2022-07-10 DIAGNOSIS — R7989 Other specified abnormal findings of blood chemistry: Secondary | ICD-10-CM | POA: Diagnosis not present

## 2022-07-10 DIAGNOSIS — Z1329 Encounter for screening for other suspected endocrine disorder: Secondary | ICD-10-CM | POA: Diagnosis not present

## 2022-07-17 DIAGNOSIS — Z0001 Encounter for general adult medical examination with abnormal findings: Secondary | ICD-10-CM | POA: Diagnosis not present

## 2022-07-17 DIAGNOSIS — M654 Radial styloid tenosynovitis [de Quervain]: Secondary | ICD-10-CM | POA: Diagnosis not present

## 2022-07-17 DIAGNOSIS — G4733 Obstructive sleep apnea (adult) (pediatric): Secondary | ICD-10-CM | POA: Diagnosis not present

## 2022-07-17 DIAGNOSIS — K760 Fatty (change of) liver, not elsewhere classified: Secondary | ICD-10-CM | POA: Diagnosis not present

## 2022-07-17 DIAGNOSIS — Z6832 Body mass index (BMI) 32.0-32.9, adult: Secondary | ICD-10-CM | POA: Diagnosis not present

## 2022-07-17 DIAGNOSIS — C61 Malignant neoplasm of prostate: Secondary | ICD-10-CM | POA: Diagnosis not present

## 2022-07-17 DIAGNOSIS — I10 Essential (primary) hypertension: Secondary | ICD-10-CM | POA: Diagnosis not present

## 2022-07-17 DIAGNOSIS — D126 Benign neoplasm of colon, unspecified: Secondary | ICD-10-CM | POA: Diagnosis not present

## 2022-07-17 DIAGNOSIS — E7849 Other hyperlipidemia: Secondary | ICD-10-CM | POA: Diagnosis not present

## 2022-07-17 DIAGNOSIS — E1169 Type 2 diabetes mellitus with other specified complication: Secondary | ICD-10-CM | POA: Diagnosis not present

## 2022-08-28 NOTE — Progress Notes (Signed)
History of Present Illness:   3.2.2021: TRUS/Bx.  At that time, PSA was 7.5.  There was a left-sided prostate nodule.  Prostate volume was 81 mL.      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    2 cores (left base lateral, left mid lateral) revealed GS 4+3 pattern, 20/50% of cores           involved, respectively.   1 core (left apex lateral) revealed GS 4+4 pattern, 40% of core involved   CT abdomen and pelvis revealed no evidence of extraprostatic disease.  There was diverticulosis of the descending and sigmoid colon with subtle stranding along the descending distal colon suspicious for low-grade diverticulitis.  Suspected diffuse hepatic steatosis.  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  Bilateral renal cysts were noted.  There is a solitary left mid ureteral stone 5 mm in size.   Bone scan revealed no evidence of osseous metastatic disease.   He was referred to [facility] for EBRT.    10.19.2021: Pt completed EBRT on 8.25.2021. He initiated ADT on 4.28.2021 with Firmagon and completed ST ADT with a 4 mo Eligard injection on this date.   1.16.2024: Last PSA < 0.1 on 3.28.2023.    6.25.2024: Here for repeat check.  Biggest issue is that his wife had a cardiac arrest about 5 weeks ago.  She was revived, but is not doing too well and hospice care is possibly on the horizon.  He is not experiencing urinary difficulties.  He has not had blood in his urine or stool.  No recent PSA.     Past Medical History:  Diagnosis Date   Arthritis    "qwhere" (01/13/2016)   Cancer [facility])    Prostate   Diabetes mellitus without complication ([facility])    Fatty liver    H/O cardiovascular stress test 12/2015   History of kidney stones    last episode 2-3 yrs. ago, but also remarks that he has had cystocopy & lithotripsy    Hypertension    Sleep apnea     Past Surgical History:  Procedure Laterality Date   CARPAL TUNNEL RELEASE Bilateral 2003-2004    left-right   CATARACT EXTRACTION W/PHACO Right 10/29/2014   Procedure: CATARACT EXTRACTION PHACO AND INTRAOCULAR LENS PLACEMENT RIGHT EYE CDE=6.86;  Surgeon: Gemma Payor, MD;  [facility]: [facility];  Service: Ophthalmology;  Laterality: Right;   CATARACT EXTRACTION W/PHACO Left 11/26/2014   Procedure: CATARACT EXTRACTION PHACO AND INTRAOCULAR LENS PLACEMENT (IOC);  Surgeon: Gemma Payor, MD;  Location: [facility];  Service: Ophthalmology;  Laterality: Left;  CDE:5.33   COLONOSCOPY     CYSTOSCOPY/RETROGRADE/URETEROSCOPY/STONE EXTRACTION WITH BASKET  X 2   JOINT REPLACEMENT     KIDNEY STONE SURGERY  1984   LITHOTRIPSY     TONSILLECTOMY  1958   TOTAL HIP ARTHROPLASTY Left 2012   TOTAL HIP ARTHROPLASTY Right 09/01/2020   Procedure: TOTAL HIP ARTHROPLASTY ANTERIOR APPROACH;  Surgeon: Ollen Gross, MD;  [facility];  Service: Orthopedics;  Laterality: Right;    TOTAL SHOULDER ARTHROPLASTY Right 01/13/2016   TOTAL SHOULDER ARTHROPLASTY Right 01/13/2016   Procedure: RIGHT TOTAL SHOULDER ARTHROPLASTY;  Surgeon: Francena Hanly, MD;  [facility];  Service: Orthopedics;  Laterality: Right;   TOTAL SHOULDER ARTHROPLASTY Left 02/01/2017   Procedure: TOTAL SHOULDER ARTHROPLASTY;  Surgeon: Francena Hanly, MD;  [facility];  Service: Orthopedics;  Laterality: Left;    Home Medications:  Allergies as of 08/29/2022  No Known Allergies      Medication List        Accurate as of August 28, 2022  8:14 AM. If you have any questions, ask your nurse or doctor.          Lumify 0.025 % Soln Generic drug: Brimonidine Tartrate Place 1 drop into both eyes daily as needed (redness/dryness).   metFORMIN 500 MG tablet Commonly known as: GLUCOPHAGE Take 500 mg by mouth 2 (two) times daily.   ZzzQuil 25 MG Caps Generic drug: diphenhydrAMINE HCl (Sleep) Take 25 mg by mouth at bedtime as needed (sleep).        Allergies: No Known Allergies  Family History  Problem Relation Age of Onset    Diabetes Mother    Heart disease Mother    Arthritis Mother    Arthritis Father    Alcoholism Father     Social History:  reports that he has never smoked. His smokeless tobacco use includes snuff. He reports that he does not currently use alcohol after a past usage of about 56.0 standard drinks of alcohol per week. He reports that he does not use drugs.  ROS: A complete review of systems was performed.  All systems are negative except for pertinent findings as noted.  Physical Exam:  Vital signs in last 24 hours: There were no vitals taken for this visit. Constitutional:  Alert and oriented, No acute distress Cardiovascular: Regular rate  Respiratory: Normal respiratory effor Genitourinary: Normal anal sphincter tone.  Prostate 20 g, symmetric, nonnodular, nontender. Psychiatric: Normal mood and affect  I have reviewed prior pt notes  I have reviewed notes from referring/previous physicians.-Dr. Morse's note  I have reviewed urinalysis results--clear  I have independently reviewed prior imaging-prostate ultrasound volume  I have reviewed prior PSA results  I have reviewed pathology results   Impression/Assessment:  Grade group 4 prostate cancer, status post short-term ADT as well as EBRT, now almost 3 years out with excellent, durable PSA response  BPH with symptoms, doing well on tamsulosin  Plan:  PSA was checked today  I will see him back in a year for follow-up

## 2022-08-29 ENCOUNTER — Ambulatory Visit (INDEPENDENT_AMBULATORY_CARE_PROVIDER_SITE_OTHER): Payer: Medicare Other | Admitting: Urology

## 2022-08-29 ENCOUNTER — Encounter: Payer: Self-pay | Admitting: Urology

## 2022-08-29 VITALS — BP 122/70 | HR 98

## 2022-08-29 DIAGNOSIS — Z8546 Personal history of malignant neoplasm of prostate: Secondary | ICD-10-CM | POA: Diagnosis not present

## 2022-08-29 DIAGNOSIS — N401 Enlarged prostate with lower urinary tract symptoms: Secondary | ICD-10-CM

## 2022-08-29 DIAGNOSIS — N138 Other obstructive and reflux uropathy: Secondary | ICD-10-CM | POA: Diagnosis not present

## 2022-08-29 LAB — URINALYSIS, ROUTINE W REFLEX MICROSCOPIC
Bilirubin, UA: NEGATIVE
Glucose, UA: NEGATIVE
Ketones, UA: NEGATIVE
Leukocytes,UA: NEGATIVE
Nitrite, UA: NEGATIVE
Protein,UA: NEGATIVE
RBC, UA: NEGATIVE
Specific Gravity, UA: 1.025 (ref 1.005–1.030)
Urobilinogen, Ur: 0.2 mg/dL (ref 0.2–1.0)
pH, UA: 6 (ref 5.0–7.5)

## 2022-08-30 LAB — PSA: Prostate Specific Ag, Serum: 0.1 ng/mL (ref 0.0–4.0)

## 2022-08-31 ENCOUNTER — Telehealth: Payer: Self-pay

## 2022-08-31 NOTE — Telephone Encounter (Signed)
Tried calling patient with no answer. Letter mailed out.

## 2022-08-31 NOTE — Telephone Encounter (Signed)
-----   Message from Marcine Matar, MD sent at 08/31/2022  9:03 AM EDT ----- Notify pt PSA still 0 ----- Message ----- From: Troy Sine, CMA Sent: 08/31/2022   8:59 AM EDT To: Marcine Matar, MD  Please review

## 2022-09-19 ENCOUNTER — Ambulatory Visit: Payer: Medicare Other | Admitting: Urology

## 2022-10-02 DIAGNOSIS — Z86010 Personal history of colonic polyps: Secondary | ICD-10-CM | POA: Diagnosis not present

## 2022-11-16 DIAGNOSIS — H01002 Unspecified blepharitis right lower eyelid: Secondary | ICD-10-CM | POA: Diagnosis not present

## 2022-11-16 DIAGNOSIS — H35372 Puckering of macula, left eye: Secondary | ICD-10-CM | POA: Diagnosis not present

## 2022-11-16 DIAGNOSIS — H01001 Unspecified blepharitis right upper eyelid: Secondary | ICD-10-CM | POA: Diagnosis not present

## 2022-11-16 DIAGNOSIS — H01004 Unspecified blepharitis left upper eyelid: Secondary | ICD-10-CM | POA: Diagnosis not present

## 2022-11-17 DIAGNOSIS — C61 Malignant neoplasm of prostate: Secondary | ICD-10-CM | POA: Diagnosis not present

## 2022-12-07 DIAGNOSIS — E119 Type 2 diabetes mellitus without complications: Secondary | ICD-10-CM | POA: Diagnosis not present

## 2022-12-07 DIAGNOSIS — D121 Benign neoplasm of appendix: Secondary | ICD-10-CM | POA: Diagnosis not present

## 2022-12-07 DIAGNOSIS — K641 Second degree hemorrhoids: Secondary | ICD-10-CM | POA: Diagnosis not present

## 2022-12-07 DIAGNOSIS — K635 Polyp of colon: Secondary | ICD-10-CM | POA: Diagnosis not present

## 2022-12-07 DIAGNOSIS — Z860100 Personal history of colon polyps, unspecified: Secondary | ICD-10-CM | POA: Diagnosis not present

## 2022-12-07 DIAGNOSIS — Z6831 Body mass index (BMI) 31.0-31.9, adult: Secondary | ICD-10-CM | POA: Diagnosis not present

## 2022-12-07 DIAGNOSIS — G4730 Sleep apnea, unspecified: Secondary | ICD-10-CM | POA: Diagnosis not present

## 2022-12-07 DIAGNOSIS — D122 Benign neoplasm of ascending colon: Secondary | ICD-10-CM | POA: Diagnosis not present

## 2022-12-07 DIAGNOSIS — Z7984 Long term (current) use of oral hypoglycemic drugs: Secondary | ICD-10-CM | POA: Diagnosis not present

## 2022-12-07 DIAGNOSIS — Z7982 Long term (current) use of aspirin: Secondary | ICD-10-CM | POA: Diagnosis not present

## 2022-12-07 DIAGNOSIS — I10 Essential (primary) hypertension: Secondary | ICD-10-CM | POA: Diagnosis not present

## 2022-12-07 DIAGNOSIS — K644 Residual hemorrhoidal skin tags: Secondary | ICD-10-CM | POA: Diagnosis not present

## 2022-12-07 DIAGNOSIS — Z1211 Encounter for screening for malignant neoplasm of colon: Secondary | ICD-10-CM | POA: Diagnosis not present

## 2022-12-07 DIAGNOSIS — K649 Unspecified hemorrhoids: Secondary | ICD-10-CM | POA: Diagnosis not present

## 2022-12-07 DIAGNOSIS — Z923 Personal history of irradiation: Secondary | ICD-10-CM | POA: Diagnosis not present

## 2022-12-07 DIAGNOSIS — K5730 Diverticulosis of large intestine without perforation or abscess without bleeding: Secondary | ICD-10-CM | POA: Diagnosis not present

## 2022-12-07 DIAGNOSIS — Z8546 Personal history of malignant neoplasm of prostate: Secondary | ICD-10-CM | POA: Diagnosis not present

## 2022-12-07 DIAGNOSIS — D124 Benign neoplasm of descending colon: Secondary | ICD-10-CM | POA: Diagnosis not present

## 2022-12-07 DIAGNOSIS — F17220 Nicotine dependence, chewing tobacco, uncomplicated: Secondary | ICD-10-CM | POA: Diagnosis not present

## 2022-12-27 DIAGNOSIS — D126 Benign neoplasm of colon, unspecified: Secondary | ICD-10-CM | POA: Diagnosis not present

## 2022-12-27 DIAGNOSIS — K5730 Diverticulosis of large intestine without perforation or abscess without bleeding: Secondary | ICD-10-CM | POA: Diagnosis not present

## 2023-01-08 DIAGNOSIS — E7849 Other hyperlipidemia: Secondary | ICD-10-CM | POA: Diagnosis not present

## 2023-01-08 DIAGNOSIS — K760 Fatty (change of) liver, not elsewhere classified: Secondary | ICD-10-CM | POA: Diagnosis not present

## 2023-01-08 DIAGNOSIS — R7301 Impaired fasting glucose: Secondary | ICD-10-CM | POA: Diagnosis not present

## 2023-01-08 DIAGNOSIS — E7801 Familial hypercholesterolemia: Secondary | ICD-10-CM | POA: Diagnosis not present

## 2023-01-08 DIAGNOSIS — E1169 Type 2 diabetes mellitus with other specified complication: Secondary | ICD-10-CM | POA: Diagnosis not present

## 2023-01-16 DIAGNOSIS — E7849 Other hyperlipidemia: Secondary | ICD-10-CM | POA: Diagnosis not present

## 2023-01-16 DIAGNOSIS — E1169 Type 2 diabetes mellitus with other specified complication: Secondary | ICD-10-CM | POA: Diagnosis not present

## 2023-01-16 DIAGNOSIS — I10 Essential (primary) hypertension: Secondary | ICD-10-CM | POA: Diagnosis not present

## 2023-01-16 DIAGNOSIS — Z6831 Body mass index (BMI) 31.0-31.9, adult: Secondary | ICD-10-CM | POA: Diagnosis not present

## 2023-01-16 DIAGNOSIS — D126 Benign neoplasm of colon, unspecified: Secondary | ICD-10-CM | POA: Diagnosis not present

## 2023-01-16 DIAGNOSIS — G4733 Obstructive sleep apnea (adult) (pediatric): Secondary | ICD-10-CM | POA: Diagnosis not present

## 2023-01-16 DIAGNOSIS — K760 Fatty (change of) liver, not elsewhere classified: Secondary | ICD-10-CM | POA: Diagnosis not present

## 2023-03-15 DIAGNOSIS — Z96641 Presence of right artificial hip joint: Secondary | ICD-10-CM | POA: Diagnosis not present

## 2023-03-19 DIAGNOSIS — C61 Malignant neoplasm of prostate: Secondary | ICD-10-CM | POA: Diagnosis not present

## 2023-04-05 DIAGNOSIS — C61 Malignant neoplasm of prostate: Secondary | ICD-10-CM | POA: Diagnosis not present

## 2023-04-09 DIAGNOSIS — I10 Essential (primary) hypertension: Secondary | ICD-10-CM | POA: Diagnosis not present

## 2023-04-09 DIAGNOSIS — E7849 Other hyperlipidemia: Secondary | ICD-10-CM | POA: Diagnosis not present

## 2023-04-09 DIAGNOSIS — E1169 Type 2 diabetes mellitus with other specified complication: Secondary | ICD-10-CM | POA: Diagnosis not present

## 2023-04-09 DIAGNOSIS — R5383 Other fatigue: Secondary | ICD-10-CM | POA: Diagnosis not present

## 2023-04-09 DIAGNOSIS — R7989 Other specified abnormal findings of blood chemistry: Secondary | ICD-10-CM | POA: Diagnosis not present

## 2023-04-16 DIAGNOSIS — G4733 Obstructive sleep apnea (adult) (pediatric): Secondary | ICD-10-CM | POA: Diagnosis not present

## 2023-04-16 DIAGNOSIS — I10 Essential (primary) hypertension: Secondary | ICD-10-CM | POA: Diagnosis not present

## 2023-04-16 DIAGNOSIS — Z6830 Body mass index (BMI) 30.0-30.9, adult: Secondary | ICD-10-CM | POA: Diagnosis not present

## 2023-04-16 DIAGNOSIS — E1169 Type 2 diabetes mellitus with other specified complication: Secondary | ICD-10-CM | POA: Diagnosis not present

## 2023-04-16 DIAGNOSIS — K760 Fatty (change of) liver, not elsewhere classified: Secondary | ICD-10-CM | POA: Diagnosis not present

## 2023-05-04 DIAGNOSIS — M545 Low back pain, unspecified: Secondary | ICD-10-CM | POA: Diagnosis not present

## 2023-05-27 DIAGNOSIS — M545 Low back pain, unspecified: Secondary | ICD-10-CM | POA: Diagnosis not present

## 2023-06-04 DIAGNOSIS — M539 Dorsopathy, unspecified: Secondary | ICD-10-CM | POA: Diagnosis not present

## 2023-07-02 DIAGNOSIS — I10 Essential (primary) hypertension: Secondary | ICD-10-CM | POA: Diagnosis not present

## 2023-07-02 DIAGNOSIS — E1169 Type 2 diabetes mellitus with other specified complication: Secondary | ICD-10-CM | POA: Diagnosis not present

## 2023-07-02 DIAGNOSIS — E782 Mixed hyperlipidemia: Secondary | ICD-10-CM | POA: Diagnosis not present

## 2023-07-02 DIAGNOSIS — K760 Fatty (change of) liver, not elsewhere classified: Secondary | ICD-10-CM | POA: Diagnosis not present

## 2023-07-02 DIAGNOSIS — R5383 Other fatigue: Secondary | ICD-10-CM | POA: Diagnosis not present

## 2023-07-09 DIAGNOSIS — K760 Fatty (change of) liver, not elsewhere classified: Secondary | ICD-10-CM | POA: Diagnosis not present

## 2023-07-09 DIAGNOSIS — Z6831 Body mass index (BMI) 31.0-31.9, adult: Secondary | ICD-10-CM | POA: Diagnosis not present

## 2023-07-09 DIAGNOSIS — E782 Mixed hyperlipidemia: Secondary | ICD-10-CM | POA: Diagnosis not present

## 2023-07-09 DIAGNOSIS — I10 Essential (primary) hypertension: Secondary | ICD-10-CM | POA: Diagnosis not present

## 2023-07-09 DIAGNOSIS — E1169 Type 2 diabetes mellitus with other specified complication: Secondary | ICD-10-CM | POA: Diagnosis not present

## 2023-08-24 DIAGNOSIS — H00011 Hordeolum externum right upper eyelid: Secondary | ICD-10-CM | POA: Diagnosis not present

## 2023-08-24 DIAGNOSIS — Z6831 Body mass index (BMI) 31.0-31.9, adult: Secondary | ICD-10-CM | POA: Diagnosis not present

## 2023-08-24 DIAGNOSIS — L0390 Cellulitis, unspecified: Secondary | ICD-10-CM | POA: Diagnosis not present

## 2023-08-27 DIAGNOSIS — H00011 Hordeolum externum right upper eyelid: Secondary | ICD-10-CM | POA: Diagnosis not present

## 2023-08-27 DIAGNOSIS — Z961 Presence of intraocular lens: Secondary | ICD-10-CM | POA: Diagnosis not present

## 2023-08-27 DIAGNOSIS — H00014 Hordeolum externum left upper eyelid: Secondary | ICD-10-CM | POA: Diagnosis not present

## 2023-09-03 NOTE — Progress Notes (Incomplete)
 History of Present Illness:   3.2.2021: TRUS/Bx.  At that time, PSA was 7.5.  There was a left-sided prostate nodule.  Prostate volume was 81 mL.      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    2 cores (left base lateral, left mid lateral) revealed GS 4+3 pattern, 20/50% of cores           involved, respectively.   1 core (left apex lateral) revealed GS 4+4 pattern, 40% of core involved   CT abdomen and pelvis revealed no evidence of extraprostatic disease.  There was diverticulosis of the descending and sigmoid colon with subtle stranding along the descending distal colon suspicious for low-grade diverticulitis.  Suspected diffuse hepatic steatosis.  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  Bilateral renal cysts were noted.  There is a solitary left mid ureteral stone 5 mm in size.   Bone scan revealed no evidence of osseous metastatic disease.   He was referred to [facility] for EBRT.    10.19.2021: Pt completed EBRT on 8.25.2021. He initiated ADT on 4.28.2021 with Firmagon  and completed ST ADT with a 4 mo Eligard  injection on this date.   1.16.2024: Last PSA < 0.1 on 3.28.2023.    6.25.2024: Here for repeat check.  Biggest issue is that his wife had a cardiac arrest about 5 weeks ago.  She was revived, but is not doing too well and hospice care is possibly on the horizon. PSA 0.1  7.1.2025:      Past Medical History:  Diagnosis Date   Arthritis    qwhere (01/13/2016)   Cancer ([facility])    Prostate   Diabetes mellitus without complication ([facility])    Fatty liver    H/O cardiovascular stress test 12/2015   History of kidney stones    last episode 2-3 yrs. ago, but also remarks that he has had cystocopy & lithotripsy    Hypertension    Sleep apnea     Past Surgical History:  Procedure Laterality Date   CARPAL TUNNEL RELEASE Bilateral 2003-2004   left-right   CATARACT EXTRACTION W/PHACO Right 10/29/2014   Procedure: CATARACT  EXTRACTION PHACO AND INTRAOCULAR LENS PLACEMENT RIGHT EYE CDE=6.86;  Surgeon: Cherene Mania, MD;  Location: [facility];  Service: Ophthalmology;  Laterality: Right;   CATARACT EXTRACTION W/PHACO Left 11/26/2014   Procedure: CATARACT EXTRACTION PHACO AND INTRAOCULAR LENS PLACEMENT (IOC);  Surgeon: Cherene Mania, MD;  [facility]: [facility];  Service: Ophthalmology;  Laterality: Left;  CDE:5.33   COLONOSCOPY     CYSTOSCOPY/RETROGRADE/URETEROSCOPY/STONE EXTRACTION WITH BASKET  X 2   JOINT REPLACEMENT     KIDNEY STONE SURGERY  1984   LITHOTRIPSY     TONSILLECTOMY  1958   TOTAL HIP ARTHROPLASTY Left 2012   TOTAL HIP ARTHROPLASTY Right 09/01/2020   Procedure: TOTAL HIP ARTHROPLASTY ANTERIOR APPROACH;  Surgeon: Melodi Lerner, MD;  [facility];  Service: Orthopedics;  Laterality: Right;    TOTAL SHOULDER ARTHROPLASTY Right 01/13/2016   TOTAL SHOULDER ARTHROPLASTY Right 01/13/2016   Procedure: RIGHT TOTAL SHOULDER ARTHROPLASTY;  Surgeon: Franky Pointer, MD;  [facility];  Service: Orthopedics;  Laterality: Right;   TOTAL SHOULDER ARTHROPLASTY Left 02/01/2017   Procedure: TOTAL SHOULDER ARTHROPLASTY;  Surgeon: Pointer Franky, MD;  [facility]: [facility];  Service: Orthopedics;  Laterality: Left;    Home Medications:  Allergies as of 09/04/2023   No Known Allergies      Medication List  Accurate as of September 03, 2023  1:06 PM. If you have any questions, ask your nurse or doctor.          Lumify  0.025 % Soln Generic drug: Brimonidine  Tartrate Place 1 drop into both eyes daily as needed (redness/dryness).   metFORMIN 500 MG tablet Commonly known as: GLUCOPHAGE Take 500 mg by mouth 2 (two) times daily.   ZzzQuil 25 MG Caps Generic drug: diphenhydrAMINE  HCl (Sleep) Take 25 mg by mouth at bedtime as needed (sleep).        Allergies: No Known Allergies  Family History  Problem Relation Age of Onset   Diabetes Mother    Heart disease Mother    Arthritis Mother    Arthritis Father     Alcoholism Father     Social History:  reports that he has never smoked. His smokeless tobacco use includes snuff. He reports that he does not currently use alcohol  after a past usage of about 56.0 standard drinks of alcohol  per week. He reports that he does not use drugs.  ROS: A complete review of systems was performed.  All systems are negative except for pertinent findings as noted.  Physical Exam:  Vital signs in last 24 hours: There were no vitals taken for this visit. Constitutional:  Alert and oriented, No acute distress Cardiovascular: Regular rate  Respiratory: Normal respiratory effor Genitourinary: Normal anal sphincter tone.  Prostate 20 g, symmetric, nonnodular, nontender. Psychiatric: Normal mood and affect  I have reviewed prior pt notes  I have reviewed notes from referring/previous physicians.-Dr. Morse's note  I have reviewed urinalysis results--clear  I have independently reviewed prior imaging-prostate ultrasound volume  I have reviewed prior PSA results  I have reviewed pathology results   Impression/Assessment:  Grade group 4 prostate cancer, status post short-term ADT as well as EBRT, now almost 3 years out with excellent, durable PSA response  BPH with symptoms, doing well on tamsulosin   Plan:  PSA was checked today  I will see him back in a year for follow-up

## 2023-09-04 ENCOUNTER — Ambulatory Visit: Payer: Medicare Other | Admitting: Urology

## 2023-09-04 DIAGNOSIS — N138 Other obstructive and reflux uropathy: Secondary | ICD-10-CM

## 2023-09-04 DIAGNOSIS — Z8546 Personal history of malignant neoplasm of prostate: Secondary | ICD-10-CM

## 2023-10-01 DIAGNOSIS — Z1329 Encounter for screening for other suspected endocrine disorder: Secondary | ICD-10-CM | POA: Diagnosis not present

## 2023-10-01 DIAGNOSIS — Z131 Encounter for screening for diabetes mellitus: Secondary | ICD-10-CM | POA: Diagnosis not present

## 2023-10-01 DIAGNOSIS — R7303 Prediabetes: Secondary | ICD-10-CM | POA: Diagnosis not present

## 2023-10-01 DIAGNOSIS — E7849 Other hyperlipidemia: Secondary | ICD-10-CM | POA: Diagnosis not present

## 2023-10-12 DIAGNOSIS — I10 Essential (primary) hypertension: Secondary | ICD-10-CM | POA: Diagnosis not present

## 2023-10-12 DIAGNOSIS — Z6831 Body mass index (BMI) 31.0-31.9, adult: Secondary | ICD-10-CM | POA: Diagnosis not present

## 2023-10-12 DIAGNOSIS — K760 Fatty (change of) liver, not elsewhere classified: Secondary | ICD-10-CM | POA: Diagnosis not present

## 2023-10-12 DIAGNOSIS — E1169 Type 2 diabetes mellitus with other specified complication: Secondary | ICD-10-CM | POA: Diagnosis not present

## 2023-10-12 DIAGNOSIS — E782 Mixed hyperlipidemia: Secondary | ICD-10-CM | POA: Diagnosis not present

## 2023-12-13 DIAGNOSIS — B8809 Other acariasis: Secondary | ICD-10-CM | POA: Diagnosis not present

## 2023-12-13 DIAGNOSIS — H00021 Hordeolum internum right upper eyelid: Secondary | ICD-10-CM | POA: Diagnosis not present

## 2023-12-13 DIAGNOSIS — H00025 Hordeolum internum left lower eyelid: Secondary | ICD-10-CM | POA: Diagnosis not present

## 2023-12-13 DIAGNOSIS — H01001 Unspecified blepharitis right upper eyelid: Secondary | ICD-10-CM | POA: Diagnosis not present

## 2023-12-13 DIAGNOSIS — H00024 Hordeolum internum left upper eyelid: Secondary | ICD-10-CM | POA: Diagnosis not present

## 2023-12-13 DIAGNOSIS — H01004 Unspecified blepharitis left upper eyelid: Secondary | ICD-10-CM | POA: Diagnosis not present

## 2024-01-09 DIAGNOSIS — H00021 Hordeolum internum right upper eyelid: Secondary | ICD-10-CM | POA: Diagnosis not present

## 2024-01-09 DIAGNOSIS — H00025 Hordeolum internum left lower eyelid: Secondary | ICD-10-CM | POA: Diagnosis not present

## 2024-01-09 DIAGNOSIS — H00024 Hordeolum internum left upper eyelid: Secondary | ICD-10-CM | POA: Diagnosis not present

## 2024-01-09 DIAGNOSIS — H00022 Hordeolum internum right lower eyelid: Secondary | ICD-10-CM | POA: Diagnosis not present

## 2024-02-13 DIAGNOSIS — H00021 Hordeolum internum right upper eyelid: Secondary | ICD-10-CM | POA: Diagnosis not present

## 2024-02-13 DIAGNOSIS — H00022 Hordeolum internum right lower eyelid: Secondary | ICD-10-CM | POA: Diagnosis not present

## 2024-02-13 DIAGNOSIS — H00024 Hordeolum internum left upper eyelid: Secondary | ICD-10-CM | POA: Diagnosis not present

## 2024-02-13 DIAGNOSIS — H00025 Hordeolum internum left lower eyelid: Secondary | ICD-10-CM | POA: Diagnosis not present
